# Patient Record
Sex: Female | Born: 1955 | Race: Black or African American | Hispanic: No | Marital: Single | Smoking: Current every day smoker
Health system: Southern US, Community
[De-identification: ages and names within clinical notes are randomized; demographics above are authoritative.]

## PROBLEM LIST (undated history)

## (undated) DIAGNOSIS — K219 Gastro-esophageal reflux disease without esophagitis: Secondary | ICD-10-CM

## (undated) DIAGNOSIS — F32A Depression, unspecified: Secondary | ICD-10-CM

## (undated) DIAGNOSIS — I10 Essential (primary) hypertension: Secondary | ICD-10-CM

## (undated) DIAGNOSIS — E282 Polycystic ovarian syndrome: Secondary | ICD-10-CM

## (undated) DIAGNOSIS — F419 Anxiety disorder, unspecified: Secondary | ICD-10-CM

## (undated) DIAGNOSIS — E559 Vitamin D deficiency, unspecified: Secondary | ICD-10-CM

## (undated) DIAGNOSIS — M1990 Unspecified osteoarthritis, unspecified site: Secondary | ICD-10-CM

## (undated) DIAGNOSIS — F329 Major depressive disorder, single episode, unspecified: Secondary | ICD-10-CM

## (undated) DIAGNOSIS — M549 Dorsalgia, unspecified: Secondary | ICD-10-CM

## (undated) HISTORY — PX: TUBAL LIGATION: SHX77

## (undated) HISTORY — PX: OTHER SURGICAL HISTORY: SHX169

## (undated) HISTORY — PX: ABDOMINAL HYSTERECTOMY: SHX81

## (undated) HISTORY — PX: PARTIAL HYSTERECTOMY: SHX80

## (undated) HISTORY — PX: BACK SURGERY: SHX140

---

## 2000-02-24 ENCOUNTER — Encounter: Payer: Self-pay | Admitting: Neurosurgery

## 2000-02-26 ENCOUNTER — Encounter: Payer: Self-pay | Admitting: Neurosurgery

## 2000-02-26 ENCOUNTER — Ambulatory Visit (HOSPITAL_COMMUNITY): Admission: RE | Admit: 2000-02-26 | Discharge: 2000-02-27 | Payer: Self-pay | Admitting: Neurosurgery

## 2000-03-21 ENCOUNTER — Ambulatory Visit (HOSPITAL_COMMUNITY): Admission: RE | Admit: 2000-03-21 | Discharge: 2000-03-21 | Payer: Self-pay | Admitting: Neurosurgery

## 2000-03-21 ENCOUNTER — Encounter: Payer: Self-pay | Admitting: Neurosurgery

## 2000-04-16 ENCOUNTER — Ambulatory Visit (HOSPITAL_COMMUNITY): Admission: RE | Admit: 2000-04-16 | Discharge: 2000-04-16 | Payer: Self-pay | Admitting: Neurosurgery

## 2000-04-16 ENCOUNTER — Encounter: Payer: Self-pay | Admitting: Neurosurgery

## 2004-05-01 ENCOUNTER — Emergency Department (HOSPITAL_COMMUNITY): Admission: EM | Admit: 2004-05-01 | Discharge: 2004-05-01 | Payer: Self-pay | Admitting: Emergency Medicine

## 2006-05-11 ENCOUNTER — Ambulatory Visit: Payer: Self-pay

## 2007-06-07 ENCOUNTER — Ambulatory Visit: Payer: Self-pay

## 2008-04-01 ENCOUNTER — Ambulatory Visit: Payer: Self-pay | Admitting: Family Medicine

## 2008-06-26 ENCOUNTER — Ambulatory Visit: Payer: Self-pay

## 2008-07-04 ENCOUNTER — Ambulatory Visit: Payer: Self-pay

## 2008-07-23 ENCOUNTER — Ambulatory Visit: Payer: Self-pay

## 2009-01-08 ENCOUNTER — Ambulatory Visit: Payer: Self-pay

## 2009-03-19 ENCOUNTER — Ambulatory Visit: Payer: Self-pay | Admitting: Internal Medicine

## 2009-07-23 ENCOUNTER — Ambulatory Visit: Payer: Self-pay | Admitting: Family Medicine

## 2010-02-12 DIAGNOSIS — G5600 Carpal tunnel syndrome, unspecified upper limb: Secondary | ICD-10-CM | POA: Insufficient documentation

## 2010-02-25 DIAGNOSIS — M2550 Pain in unspecified joint: Secondary | ICD-10-CM | POA: Insufficient documentation

## 2010-03-16 ENCOUNTER — Encounter: Payer: Self-pay | Admitting: Orthopedic Surgery

## 2010-03-24 ENCOUNTER — Ambulatory Visit: Payer: Self-pay | Admitting: Orthopedic Surgery

## 2010-03-27 ENCOUNTER — Encounter: Payer: Self-pay | Admitting: Orthopedic Surgery

## 2010-06-03 ENCOUNTER — Encounter: Payer: Self-pay | Admitting: Orthopedic Surgery

## 2010-06-27 ENCOUNTER — Encounter: Payer: Self-pay | Admitting: Orthopedic Surgery

## 2010-07-06 ENCOUNTER — Encounter: Payer: Self-pay | Admitting: Orthopedic Surgery

## 2010-07-14 ENCOUNTER — Ambulatory Visit: Payer: Self-pay | Admitting: Family Medicine

## 2010-07-28 ENCOUNTER — Encounter: Payer: Self-pay | Admitting: Orthopedic Surgery

## 2010-08-12 ENCOUNTER — Ambulatory Visit: Payer: Self-pay | Admitting: Orthopedic Surgery

## 2010-09-08 ENCOUNTER — Ambulatory Visit: Payer: Self-pay | Admitting: Pain Medicine

## 2010-10-05 ENCOUNTER — Ambulatory Visit: Payer: Self-pay | Admitting: Pain Medicine

## 2010-10-08 ENCOUNTER — Ambulatory Visit: Payer: Self-pay | Admitting: Pain Medicine

## 2010-10-22 ENCOUNTER — Ambulatory Visit: Payer: Self-pay | Admitting: Gastroenterology

## 2010-10-23 LAB — PATHOLOGY REPORT

## 2011-04-23 ENCOUNTER — Emergency Department (HOSPITAL_COMMUNITY)
Admission: EM | Admit: 2011-04-23 | Discharge: 2011-04-23 | Disposition: A | Discharge status: Home / Self Care | Payer: Self-pay | Attending: Emergency Medicine | Admitting: Emergency Medicine

## 2011-04-23 ENCOUNTER — Encounter: Payer: Self-pay | Admitting: *Deleted

## 2011-04-23 DIAGNOSIS — S61419A Laceration without foreign body of unspecified hand, initial encounter: Secondary | ICD-10-CM

## 2011-04-23 DIAGNOSIS — I10 Essential (primary) hypertension: Secondary | ICD-10-CM | POA: Insufficient documentation

## 2011-04-23 DIAGNOSIS — F17200 Nicotine dependence, unspecified, uncomplicated: Secondary | ICD-10-CM | POA: Insufficient documentation

## 2011-04-23 DIAGNOSIS — S61409A Unspecified open wound of unspecified hand, initial encounter: Secondary | ICD-10-CM | POA: Insufficient documentation

## 2011-04-23 DIAGNOSIS — Y92009 Unspecified place in unspecified non-institutional (private) residence as the place of occurrence of the external cause: Secondary | ICD-10-CM | POA: Insufficient documentation

## 2011-04-23 DIAGNOSIS — W268XXA Contact with other sharp object(s), not elsewhere classified, initial encounter: Secondary | ICD-10-CM | POA: Insufficient documentation

## 2011-04-23 HISTORY — DX: Essential (primary) hypertension: I10

## 2011-04-23 MED ORDER — DOUBLE ANTIBIOTIC 500-10000 UNIT/GM EX OINT
TOPICAL_OINTMENT | Freq: Once | CUTANEOUS | Status: AC
  Administered 2011-04-23: 08:00:00 via TOPICAL
  Filled 2011-04-23: qty 1

## 2011-04-23 MED ORDER — CEPHALEXIN 250 MG PO CAPS: 250.0000 mg | ORAL_CAPSULE | Freq: Four times a day (QID) | ORAL | Status: AC

## 2011-04-23 MED ORDER — TETANUS-DIPHTH-ACELL PERTUSSIS 5-2.5-18.5 LF-MCG/0.5 IM SUSP
0.5000 mL | Freq: Once | INTRAMUSCULAR | Status: AC
  Administered 2011-04-23: 0.5 mL via INTRAMUSCULAR
  Filled 2011-04-23: qty 0.5

## 2011-04-23 NOTE — ED Provider Notes (Signed)
History     Chief Complaint  Patient presents with  . Extremity Laceration   Patient is a 55 y.o. female presenting with skin laceration. The history is provided by the patient. No language interpreter was used.  Laceration  Incident onset: 18 hours ago. Pain location: volar aspect of right wrist. Size: .5cm. Injury mechanism: curtain rod. The patient is experiencing no pain. She reports no foreign bodies present. Her tetanus status is unknown.  Patient c/o laceration to anterior aspect of right wrist sustained approximately 18 hours ago while she was putting up a curtain rod and went to catch the rod as it slipped out of her hand. Tetanus status unknown. Patient is right hand dominant. Denies numbness, tingling. No other complaints. Putting up a curtain rod and sustained laceration to right wrist approximately 18 hours ago  Patient seen at 7:39 AM   Past Medical History  Diagnosis Date  . Hypertension     Past Surgical History  Procedure Date  . Carpel tunnel     No family history on file.  History  Substance Use Topics  . Smoking status: Current Everyday Smoker  . Smokeless tobacco: Not on file  . Alcohol Use: No    OB History    Grav Para Term Preterm Abortions TAB SAB Ect Mult Living                  Review of Systems  Skin:       Laceration  Neurological: Negative for numbness.  All other systems reviewed and are negative.  All other systems negative except as noted in HPI.   Physical Exam  BP 156/85  Pulse 72  Temp(Src) 98.8 F (37.1 C) (Oral)  Resp 16  Ht 5\' 6"  (1.676 m)  Wt 150 lb (68.04 kg)  BMI 24.21 kg/m2  SpO2 100%  Physical Exam CONSTITUTIONAL: Well developed/well nourished, hypertensive HEAD AND FACE: Normocephalic/atraumatic EYES: EOMI/PERRL ENMT: Mucous membranes moist NECK: supple, ROM normal CV: regular rate LUNGS: no apparent distress NEURO: Pt is awake/alert, moves all extremitiesx4,  neurovascular intact distal to  injury EXTREMITIES: pulses normal, full ROM of right wrist and fingers SKIN: warm, color normal, .5cm laceration to volar aspect of right wrist with no active bleeding andno bone or tendon exposed with distal cap refill and neurovascular intact PSYCH: no abnormalities of mood noted  ED Course  Wound repair Date/Time: 04/23/2011 8:16 AM Performed by: Joya Gaskins Authorized by: Joya Gaskins Consent: Verbal consent obtained. Risks and benefits: risks, benefits and alternatives were discussed Consent given by: patient Preparation: Patient was prepped and draped in the usual sterile fashion. Local anesthesia used: no Patient sedated: no Patient tolerance: Patient tolerated the procedure well with no immediate complications. Comments: Wound was cleansed with high flow tap water, skin around wound was cleansed with betadine and wound cleansed with safe cleans.  Wound not repaired due to timing of injury.  No bone/tendon/foreign body noted   7:46 AM-Patient advised of reasons to return to ED for current condition MDM Nursing notes reviewed and considered in documentation    Chart written by Clarita Crane acting as scribe for Joya Gaskins, MD  I personally performed the services described in this documentation, which was scribed in my presence. The recorded information has been reviewed and considered. Joya Gaskins, MD      Joya Gaskins, MD 04/23/11 [phone]

## 2011-04-23 NOTE — ED Notes (Signed)
Pt states she cut right wrist on curtain rod yesterday at 1410.

## 2011-07-28 ENCOUNTER — Ambulatory Visit: Payer: Self-pay

## 2012-08-09 ENCOUNTER — Ambulatory Visit: Payer: Self-pay

## 2013-02-07 ENCOUNTER — Encounter: Payer: Self-pay | Admitting: Family Medicine

## 2013-02-25 ENCOUNTER — Encounter: Payer: Self-pay | Admitting: Family Medicine

## 2013-03-27 ENCOUNTER — Encounter: Payer: Self-pay | Admitting: Family Medicine

## 2013-08-10 ENCOUNTER — Ambulatory Visit: Payer: Self-pay | Admitting: Family Medicine

## 2014-09-08 ENCOUNTER — Emergency Department (HOSPITAL_COMMUNITY): Payer: BC Managed Care – PPO

## 2014-09-08 ENCOUNTER — Emergency Department (HOSPITAL_COMMUNITY)
Admission: EM | Admit: 2014-09-08 | Discharge: 2014-09-08 | Disposition: A | Discharge status: Home / Self Care | Payer: BC Managed Care – PPO | Attending: Emergency Medicine | Admitting: Emergency Medicine

## 2014-09-08 ENCOUNTER — Encounter (HOSPITAL_COMMUNITY): Payer: Self-pay | Admitting: Emergency Medicine

## 2014-09-08 DIAGNOSIS — R1013 Epigastric pain: Secondary | ICD-10-CM | POA: Insufficient documentation

## 2014-09-08 DIAGNOSIS — R1011 Right upper quadrant pain: Secondary | ICD-10-CM | POA: Diagnosis not present

## 2014-09-08 DIAGNOSIS — Z720 Tobacco use: Secondary | ICD-10-CM | POA: Diagnosis not present

## 2014-09-08 DIAGNOSIS — G4700 Insomnia, unspecified: Secondary | ICD-10-CM | POA: Insufficient documentation

## 2014-09-08 DIAGNOSIS — R109 Unspecified abdominal pain: Secondary | ICD-10-CM

## 2014-09-08 DIAGNOSIS — D3500 Benign neoplasm of unspecified adrenal gland: Secondary | ICD-10-CM

## 2014-09-08 DIAGNOSIS — K760 Fatty (change of) liver, not elsewhere classified: Secondary | ICD-10-CM

## 2014-09-08 DIAGNOSIS — R1012 Left upper quadrant pain: Secondary | ICD-10-CM | POA: Insufficient documentation

## 2014-09-08 DIAGNOSIS — K429 Umbilical hernia without obstruction or gangrene: Secondary | ICD-10-CM

## 2014-09-08 DIAGNOSIS — Z79899 Other long term (current) drug therapy: Secondary | ICD-10-CM | POA: Insufficient documentation

## 2014-09-08 HISTORY — DX: Polycystic ovarian syndrome: E28.2

## 2014-09-08 LAB — URINALYSIS, ROUTINE W REFLEX MICROSCOPIC
Bilirubin Urine: NEGATIVE
GLUCOSE, UA: NEGATIVE mg/dL
HGB URINE DIPSTICK: NEGATIVE
Ketones, ur: NEGATIVE mg/dL
LEUKOCYTES UA: NEGATIVE
Nitrite: NEGATIVE
PH: 6 (ref 5.0–8.0)
PROTEIN: NEGATIVE mg/dL
Specific Gravity, Urine: 1.015 (ref 1.005–1.030)
Urobilinogen, UA: 0.2 mg/dL (ref 0.0–1.0)

## 2014-09-08 LAB — LIPASE, BLOOD: LIPASE: 19 U/L (ref 11–59)

## 2014-09-08 LAB — CBC WITH DIFFERENTIAL/PLATELET
BASOS ABS: 0 10*3/uL (ref 0.0–0.1)
BASOS PCT: 0 % (ref 0–1)
Eosinophils Absolute: 0.1 10*3/uL (ref 0.0–0.7)
Eosinophils Relative: 2 % (ref 0–5)
HCT: 42 % (ref 36.0–46.0)
Hemoglobin: 14.3 g/dL (ref 12.0–15.0)
LYMPHS PCT: 45 % (ref 12–46)
Lymphs Abs: 2.6 10*3/uL (ref 0.7–4.0)
MCH: 30.6 pg (ref 26.0–34.0)
MCHC: 34 g/dL (ref 30.0–36.0)
MCV: 89.9 fL (ref 78.0–100.0)
Monocytes Absolute: 0.4 10*3/uL (ref 0.1–1.0)
Monocytes Relative: 7 % (ref 3–12)
NEUTROS ABS: 2.7 10*3/uL (ref 1.7–7.7)
Neutrophils Relative %: 46 % (ref 43–77)
PLATELETS: 251 10*3/uL (ref 150–400)
RBC: 4.67 MIL/uL (ref 3.87–5.11)
RDW: 13.7 % (ref 11.5–15.5)
WBC: 5.9 10*3/uL (ref 4.0–10.5)

## 2014-09-08 LAB — COMPREHENSIVE METABOLIC PANEL
ALBUMIN: 3.8 g/dL (ref 3.5–5.2)
ALT: 12 U/L (ref 0–35)
AST: 16 U/L (ref 0–37)
Alkaline Phosphatase: 51 U/L (ref 39–117)
Anion gap: 11 (ref 5–15)
BILIRUBIN TOTAL: 0.3 mg/dL (ref 0.3–1.2)
BUN: 9 mg/dL (ref 6–23)
CALCIUM: 9.2 mg/dL (ref 8.4–10.5)
CHLORIDE: 105 meq/L (ref 96–112)
CO2: 24 meq/L (ref 19–32)
Creatinine, Ser: 1 mg/dL (ref 0.50–1.10)
GFR calc Af Amer: 71 mL/min — ABNORMAL LOW (ref >90)
GFR, EST NON AFRICAN AMERICAN: 61 mL/min — AB (ref >90)
Glucose, Bld: 94 mg/dL (ref 70–99)
Potassium: 4 mEq/L (ref 3.7–5.3)
SODIUM: 140 meq/L (ref 137–147)
Total Protein: 7.1 g/dL (ref 6.0–8.3)

## 2014-09-08 LAB — POC OCCULT BLOOD, ED: Fecal Occult Bld: NEGATIVE

## 2014-09-08 MED ORDER — GI COCKTAIL ~~LOC~~
30.0000 mL | Freq: Once | ORAL | Status: AC
  Administered 2014-09-08: 30 mL via ORAL
  Filled 2014-09-08: qty 30

## 2014-09-08 MED ORDER — RANITIDINE HCL 150 MG PO TABS: 150.0000 mg | ORAL_TABLET | Freq: Two times a day (BID) | ORAL | Status: DC

## 2014-09-08 MED ORDER — IOHEXOL 300 MG/ML  SOLN
100.0000 mL | Freq: Once | INTRAMUSCULAR | Status: AC | PRN
  Administered 2014-09-08: 100 mL via INTRAVENOUS

## 2014-09-08 MED ORDER — IOHEXOL 300 MG/ML  SOLN
50.0000 mL | Freq: Once | INTRAMUSCULAR | Status: AC | PRN
Start: 2014-09-08 — End: 2014-09-08
  Administered 2014-09-08: 50 mL via ORAL

## 2014-09-08 NOTE — ED Notes (Signed)
MD at bedside. 

## 2014-09-08 NOTE — Discharge Instructions (Signed)
Please call your doctor for a followup appointment within 24-48 hours. When you talk to your doctor please let them know that you were seen in the emergency department and have them acquire all of your records so that they can discuss the findings with you and formulate a treatment plan to fully care for your new and ongoing problems. ° °Abdominal Pain °Many things can cause abdominal pain. Usually, abdominal pain is not caused by a disease and will improve without treatment. It can often be observed and treated at home. Your health care provider will do a physical exam and possibly order blood tests and X-rays to help determine the seriousness of your pain. However, in many cases, more time must pass before a clear cause of the pain can be found. Before that point, your health care provider may not know if you need more testing or further treatment. °HOME CARE INSTRUCTIONS  °Monitor your abdominal pain for any changes. The following actions may help to alleviate any discomfort you are experiencing: °· Only take over-the-counter or prescription medicines as directed by your health care provider. °· Do not take laxatives unless directed to do so by your health care provider. °· Try a clear liquid diet (broth, tea, or water) as directed by your health care provider. Slowly move to a bland diet as tolerated. °SEEK MEDICAL CARE IF: °· You have unexplained abdominal pain. °· You have abdominal pain associated with nausea or diarrhea. °· You have pain when you urinate or have a bowel movement. °· You experience abdominal pain that wakes you in the night. °· You have abdominal pain that is worsened or improved by eating food. °· You have abdominal pain that is worsened with eating fatty foods. °· You have a fever. °SEEK IMMEDIATE MEDICAL CARE IF:  °· Your pain does not go away within 2 hours. °· You keep throwing up (vomiting). °· Your pain is felt only in portions of the abdomen, such as the right side or the left lower  portion of the abdomen. °· You pass bloody or black tarry stools. °MAKE SURE YOU: °· Understand these instructions.   °· Will watch your condition.   °· Will get help right away if you are not doing well or get worse.   °Document Released: 06/23/2005 Document Revised: 09/18/2013 Document Reviewed: 05/23/2013 °[employer]® Patient Information ©2015 [employer]. This information is not intended to replace advice given to you by your health care provider. Make sure you discuss any questions you have with your health care provider. ° °

## 2014-09-08 NOTE — ED Provider Notes (Signed)
CSN: [id number]     Arrival date & time 09/08/14  0747 History   First MD Initiated Contact with Patient 09/08/14 [phone]     Chief Complaint  Patient presents with  . Abdominal Pain     (Consider location/radiation/quality/duration/timing/severity/associated sxs/prior Treatment) Patient is a 58 y.o. female presenting with abdominal pain. The history is provided by the patient.  Abdominal Pain Pain location:  Epigastric Pain quality: aching, bloating and heavy   Pain radiates to:  Suprapubic region Pain severity:  Moderate Onset quality:  Gradual Duration:  4 weeks Timing:  Constant Progression:  Worsening Chronicity:  New Relieved by:  Nothing Worsened by:  Eating Ineffective treatments:  OTC medications Associated symptoms: belching and dysuria   Associated symptoms: no chest pain, no chills, no constipation, no cough, no diarrhea, no fever, no nausea, no shortness of breath and no vomiting    RHEANNA Spencer is a 58 y.o. female who presents to the ED with upper abdominal pain that started a month ago. Hx of HTN and PCOS. She is not sexually active and has had a hysterectomy. She still has her ovaries. She does smoke 1/2 ppd.   Past Medical History  Diagnosis Date  . Hypertension   . Polycystic disease, ovaries    Past Surgical History  Procedure Laterality Date  . Carpel tunnel    . Partial hysterectomy     Family History  Problem Relation Age of Onset  . Stroke Mother   . Cancer Father   . Kidney disease Mother    History  Substance Use Topics  . Smoking status: Current Every Day Smoker -- 0.50 packs/day for 30 years    Types: Cigarettes  . Smokeless tobacco: Never Used  . Alcohol Use: No   OB History    Gravida Para Term Preterm AB TAB SAB Ectopic Multiple Living            0     Review of Systems  Constitutional: Negative for fever and chills.  HENT: Negative.   Eyes: Positive for visual disturbance. Negative for pain, redness and itching.   Respiratory: Negative for cough, shortness of breath and wheezing.   Cardiovascular: Negative for chest pain and palpitations.  Gastrointestinal: Positive for abdominal pain. Negative for nausea, vomiting, diarrhea and constipation.  Genitourinary: Positive for dysuria and urgency. Negative for frequency.  Musculoskeletal: Negative for neck pain and neck stiffness. Back pain: chronic.  Skin: Negative for rash.  Neurological: Negative for dizziness, syncope and headaches.  Psychiatric/Behavioral: Negative for confusion. The patient is not nervous/anxious.       Allergies  Review of patient's allergies indicates no known allergies.  Home Medications   Prior to Admission medications   Medication Sig Start Date End Date Taking? Authorizing Provider  acetaminophen (TYLENOL) 500 MG tablet Take 1,000 mg by mouth every 6 (six) hours as needed. For pain    Yes Historical Provider, MD  amLODipine (NORVASC) 10 MG tablet Take 1 tablet by mouth daily. 08/28/14  Yes Historical Provider, MD  BLACK COHOSH PO Take 1 tablet by mouth daily as needed (menopause).   Yes Historical Provider, MD  Cholecalciferol (VITAMIN D PO) Take 1 tablet by mouth daily.   Yes Historical Provider, MD  Cyanocobalamin (VITAMIN B-12 PO) Take 1 tablet by mouth every other day.   Yes Historical Provider, MD  GARLIC PO Take 1 tablet by mouth daily.   Yes Historical Provider, MD  LORazepam (ATIVAN) 1 MG tablet Take 0.5 tablets by mouth 3 (  three) times daily as needed for anxiety.  08/28/14  Yes Historical Provider, MD  ranitidine (ZANTAC) 150 MG tablet Take 1 tablet (150 mg total) by mouth 2 (two) times daily. 09/08/14   [name], MD   BP 133/95 mmHg  Pulse 64  Temp(Src) 98.2 F (36.8 C) (Oral)  Resp 20  Ht 5' 6.5" (1.689 m)  Wt 162 lb (73.483 kg)  BMI 25.76 kg/m2  SpO2 100% Physical Exam  Constitutional: She is oriented to person, place, and time. She appears well-developed and well-nourished.  HENT:  Head:  Normocephalic and atraumatic.  Right Ear: Tympanic membrane normal.  Left Ear: Tympanic membrane normal.  Nose: Nose normal.  Mouth/Throat: Uvula is midline, oropharynx is clear and moist and mucous membranes are normal.  Eyes: EOM are normal.  Neck: Normal range of motion. Neck supple. No JVD present.  Cardiovascular: Normal rate and regular rhythm.   Pulmonary/Chest: Effort normal and breath sounds normal.  Abdominal: Soft. Bowel sounds are normal. There is tenderness in the right upper quadrant, epigastric area, periumbilical area and left upper quadrant.  Musculoskeletal: Normal range of motion. She exhibits no edema.  Neurological: She is alert and oriented to person, place, and time. No cranial nerve deficit.  Skin: Skin is warm and dry.  Psychiatric: She has a normal mood and affect. Her behavior is normal.  Nursing note and vitals reviewed.   ED Course  Procedures (including critical care time) Labs Review Results for orders placed or performed during the hospital encounter of 09/08/14 (from the past 24 hour(s))  CBC with Differential     Status: None   Collection Time: 09/08/14  8:14 AM  Result Value Ref Range   WBC 5.9 4.0 - 10.5 K/uL   RBC 4.67 3.87 - 5.11 MIL/uL   Hemoglobin 14.3 12.0 - 15.0 g/dL   HCT 42.0 36.0 - 46.0 %   MCV 89.9 78.0 - 100.0 fL   MCH 30.6 26.0 - 34.0 pg   MCHC 34.0 30.0 - 36.0 g/dL   RDW 13.7 11.5 - 15.5 %   Platelets 251 150 - 400 K/uL   Neutrophils Relative % 46 43 - 77 %   Neutro Abs 2.7 1.7 - 7.7 K/uL   Lymphocytes Relative 45 12 - 46 %   Lymphs Abs 2.6 0.7 - 4.0 K/uL   Monocytes Relative 7 3 - 12 %   Monocytes Absolute 0.4 0.1 - 1.0 K/uL   Eosinophils Relative 2 0 - 5 %   Eosinophils Absolute 0.1 0.0 - 0.7 K/uL   Basophils Relative 0 0 - 1 %   Basophils Absolute 0.0 0.0 - 0.1 K/uL  Comprehensive metabolic panel     Status: Abnormal   Collection Time: 09/08/14  8:14 AM  Result Value Ref Range   Sodium 140 137 - 147 mEq/L   Potassium  4.0 3.7 - 5.3 mEq/L   Chloride 105 96 - 112 mEq/L   CO2 24 19 - 32 mEq/L   Glucose, Bld 94 70 - 99 mg/dL   BUN 9 6 - 23 mg/dL   Creatinine, Ser 1.00 0.50 - 1.10 mg/dL   Calcium 9.2 8.4 - 10.5 mg/dL   Total Protein 7.1 6.0 - 8.3 g/dL   Albumin 3.8 3.5 - 5.2 g/dL   AST 16 0 - 37 U/L   ALT 12 0 - 35 U/L   Alkaline Phosphatase 51 39 - 117 U/L   Total Bilirubin 0.3 0.3 - 1.2 mg/dL   GFR calc non  Af Amer 61 (L) >90 mL/min   GFR calc Af Amer 71 (L) >90 mL/min   Anion gap 11 5 - 15  Lipase, blood     Status: None   Collection Time: 09/08/14  8:14 AM  Result Value Ref Range   Lipase 19 11 - 59 U/L  Urinalysis, Routine w reflex microscopic     Status: None   Collection Time: 09/08/14  8:21 AM  Result Value Ref Range   Color, Urine YELLOW YELLOW   APPearance CLEAR CLEAR   Specific Gravity, Urine 1.015 1.005 - 1.030   pH 6.0 5.0 - 8.0   Glucose, UA NEGATIVE NEGATIVE mg/dL   Hgb urine dipstick NEGATIVE NEGATIVE   Bilirubin Urine NEGATIVE NEGATIVE   Ketones, ur NEGATIVE NEGATIVE mg/dL   Protein, ur NEGATIVE NEGATIVE mg/dL   Urobilinogen, UA 0.2 0.0 - 1.0 mg/dL   Nitrite NEGATIVE NEGATIVE   Leukocytes, UA NEGATIVE NEGATIVE  POC occult blood, ED Provider will collect     Status: None   Collection Time: 09/08/14  8:50 AM  Result Value Ref Range   Fecal Occult Bld NEGATIVE NEGATIVE    Imaging Review Ct Abdomen Pelvis W Contrast  09/08/2014   CLINICAL DATA:  Epigastric pain for more than a month. Pain increases with stress or when hungry. Burning with urination. Previous partial hysterectomy.  EXAM: CT ABDOMEN AND PELVIS WITH CONTRAST  TECHNIQUE: Multidetector CT imaging of the abdomen and pelvis was performed using the standard protocol following bolus administration of intravenous contrast.  CONTRAST:  20mL OMNIPAQUE IOHEXOL 300 MG/ML SOLN, 173mL OMNIPAQUE IOHEXOL 300 MG/ML SOLN  COMPARISON:  None.  FINDINGS: Lower chest: Minimal dependent changes in the lung bases. Emphysematous changes  are noted.  Upper abdomen: There is focal fatty infiltration adjacent to the falciform ligament. Within the lower portion of the right hepatic lobe there is a 5 mm low-attenuation lesion. Cyst favored to represent a cyst. An 8 mm lesion is identified within the anterior segment of the right hepatic lobe, not further characterized. No focal abnormality identified within the spleen, pancreas, a for left adrenal gland. There is a benign adenoma involving the right adrenal gland, measuring 1.3 cm. No focal abnormality identified within the kidneys. There is bilateral symmetric renal enhancement and excretion. The gallbladder is present.  Gastrointestinal tract: The stomach and small bowel loops are normal in appearance. The appendix is well seen and has a normal appearance. Colonic loops have a normal appearance.  Pelvis: The uterus is absent. No adnexal mass. No free pelvic fluid. Normal appearance of the urinary bladder.  Retroperitoneum: There is mild atherosclerotic calcification of the abdominal aorta. No aneurysm. No retroperitoneal or mesenteric adenopathy.  Abdominal wall: Small fat containing paraumbilical hernia.  Osseous structures: There are degenerative changes in both hips. No suspicious lytic or blastic lesions are identified.  IMPRESSION: 1. Small liver lesions, statistically likely benign. 2. Fatty infiltration of the liver. 3. Benign right adrenal adenoma 1.3 cm. 4. Normal appearing stomach. 5. Normal appendix. 6. Status post hysterectomy. 7. Small fat containing paraumbilical hernia. 8. Degenerative changes in both hips.   Electronically Signed   By: Shon Hale M.D.   On: 09/08/2014 10:34   EKG Interpretation  MDM  58 y.o. female with abdominal pain for over one month. Dr. Sabra Heck in to discuss CT and lab results with the patient and plan of care. She voices understanding and agrees with plan.  D/c done by Dr. Sabra Heck.   Final diagnoses:  Abdominal pain  Adrenal adenoma, unspecified laterality   Fatty liver  Umbilical hernia without obstruction and without gangrene        Ashley Murrain, NP 09/08/14 1102  [name], MD 09/08/14 1556

## 2014-09-08 NOTE — ED Notes (Signed)
PT states epigastric pain for over a month and increases with stress or when hungry. PT also c/o burring with urination.

## 2014-09-08 NOTE — ED Notes (Signed)
NP at bedside.

## 2014-09-08 NOTE — ED Provider Notes (Signed)
58 year old female, no prior abdominal surgical history who presents with epigastric discomfort. This has been ongoing for approximately a month, on exam she has reproducible tenderness in the left upper, right upper and mid epigastrium, there is a fullness but no guarding or peritoneal signs, no pain in the lower abdomen, normal cardiac and pulmonary exams, no peripheral edema. Labs and CT scan will be ordered to further rule out other sources such as pancreatitis, peptic ulcer disease, cancer or other sources of an increased fullness and pain. The patient is in agreement with the plan.   EKG Interpretation  Date/Time:  Sunday September 08 2014 08:11:02 EST Ventricular Rate:  79 PR Interval:  156 QRS Duration: 76 QT Interval:  361 QTC Calculation: 414 R Axis:   30 Text Interpretation:  Normal sinus rhythm Normal ECG since last tracing no significant change Confirmed by Cesario Weidinger  MD, Rider Ermis ([id number]) on 09/08/2014 3:56:34 PM        Medical screening examination/treatment/procedure(s) were conducted as a shared visit with non-physician practitioner(s) and myself.  I personally evaluated the patient during the encounter.  Clinical Impression:   Final diagnoses:  Abdominal pain  Adrenal adenoma, unspecified laterality  Fatty liver  Umbilical hernia without obstruction and without gangrene         [name], MD 09/08/14 1556

## 2014-09-08 NOTE — ED Notes (Signed)
Patient c/o upper abd pain. Per patient started a month ago. Per patient started after getting stressed from "overseer" a month ago and pain has been getting progressively worse since. Denies any nausea, vomiting, diarrhea, fevers, or urinary symptoms.

## 2014-10-10 ENCOUNTER — Ambulatory Visit: Payer: Self-pay | Admitting: Family Medicine

## 2014-11-12 DIAGNOSIS — R1013 Epigastric pain: Secondary | ICD-10-CM | POA: Insufficient documentation

## 2015-01-24 ENCOUNTER — Other Ambulatory Visit: Payer: Self-pay

## 2015-01-24 ENCOUNTER — Ambulatory Visit
Admit: 2015-01-24 | Disposition: A | Discharge status: Home / Self Care | Payer: Self-pay | Attending: Gastroenterology | Admitting: Gastroenterology

## 2015-02-17 LAB — SURGICAL PATHOLOGY

## 2016-04-01 ENCOUNTER — Encounter: Payer: Self-pay | Admitting: *Deleted

## 2016-04-02 ENCOUNTER — Ambulatory Visit: Payer: BLUE CROSS/BLUE SHIELD | Admitting: Anesthesiology

## 2016-04-02 ENCOUNTER — Ambulatory Visit
Admission: RE | Admit: 2016-04-02 | Discharge: 2016-04-02 | Disposition: A | Discharge status: Home / Self Care | Payer: BLUE CROSS/BLUE SHIELD | Source: Ambulatory Visit | Attending: Gastroenterology | Admitting: Gastroenterology

## 2016-04-02 ENCOUNTER — Encounter: Payer: Self-pay | Admitting: *Deleted

## 2016-04-02 ENCOUNTER — Encounter
Admission: RE | Disposition: A | Discharge status: Home / Self Care | Payer: Self-pay | Source: Ambulatory Visit | Attending: Gastroenterology

## 2016-04-02 DIAGNOSIS — K219 Gastro-esophageal reflux disease without esophagitis: Secondary | ICD-10-CM | POA: Insufficient documentation

## 2016-04-02 DIAGNOSIS — D120 Benign neoplasm of cecum: Secondary | ICD-10-CM | POA: Diagnosis not present

## 2016-04-02 DIAGNOSIS — F17200 Nicotine dependence, unspecified, uncomplicated: Secondary | ICD-10-CM | POA: Insufficient documentation

## 2016-04-02 DIAGNOSIS — I10 Essential (primary) hypertension: Secondary | ICD-10-CM | POA: Diagnosis not present

## 2016-04-02 DIAGNOSIS — D123 Benign neoplasm of transverse colon: Secondary | ICD-10-CM | POA: Diagnosis not present

## 2016-04-02 DIAGNOSIS — K5730 Diverticulosis of large intestine without perforation or abscess without bleeding: Secondary | ICD-10-CM | POA: Insufficient documentation

## 2016-04-02 DIAGNOSIS — Z1211 Encounter for screening for malignant neoplasm of colon: Secondary | ICD-10-CM | POA: Insufficient documentation

## 2016-04-02 DIAGNOSIS — Z886 Allergy status to analgesic agent status: Secondary | ICD-10-CM | POA: Diagnosis not present

## 2016-04-02 DIAGNOSIS — E282 Polycystic ovarian syndrome: Secondary | ICD-10-CM | POA: Diagnosis not present

## 2016-04-02 DIAGNOSIS — Z86010 Personal history of colonic polyps: Secondary | ICD-10-CM | POA: Diagnosis present

## 2016-04-02 DIAGNOSIS — F329 Major depressive disorder, single episode, unspecified: Secondary | ICD-10-CM | POA: Diagnosis not present

## 2016-04-02 DIAGNOSIS — Z79899 Other long term (current) drug therapy: Secondary | ICD-10-CM | POA: Diagnosis not present

## 2016-04-02 DIAGNOSIS — Z888 Allergy status to other drugs, medicaments and biological substances status: Secondary | ICD-10-CM | POA: Insufficient documentation

## 2016-04-02 DIAGNOSIS — E559 Vitamin D deficiency, unspecified: Secondary | ICD-10-CM | POA: Diagnosis not present

## 2016-04-02 DIAGNOSIS — M1990 Unspecified osteoarthritis, unspecified site: Secondary | ICD-10-CM | POA: Diagnosis not present

## 2016-04-02 DIAGNOSIS — F419 Anxiety disorder, unspecified: Secondary | ICD-10-CM | POA: Insufficient documentation

## 2016-04-02 HISTORY — DX: Dorsalgia, unspecified: M54.9

## 2016-04-02 HISTORY — DX: Gastro-esophageal reflux disease without esophagitis: K21.9

## 2016-04-02 HISTORY — PX: COLONOSCOPY WITH PROPOFOL: SHX5780

## 2016-04-02 HISTORY — DX: Unspecified osteoarthritis, unspecified site: M19.90

## 2016-04-02 HISTORY — DX: Vitamin D deficiency, unspecified: E55.9

## 2016-04-02 HISTORY — DX: Anxiety disorder, unspecified: F41.9

## 2016-04-02 HISTORY — DX: Depression, unspecified: F32.A

## 2016-04-02 HISTORY — DX: Major depressive disorder, single episode, unspecified: F32.9

## 2016-04-02 SURGERY — COLONOSCOPY WITH PROPOFOL
Anesthesia: General

## 2016-04-02 MED ORDER — SODIUM CHLORIDE 0.9 % IV SOLN: INTRAVENOUS | Status: DC

## 2016-04-02 MED ORDER — MIDAZOLAM HCL 2 MG/2ML IJ SOLN
INTRAMUSCULAR | Status: DC | PRN
Start: 2016-04-02 — End: 2016-04-02
  Administered 2016-04-02: 1 mg via INTRAVENOUS

## 2016-04-02 MED ORDER — EPHEDRINE SULFATE 50 MG/ML IJ SOLN
INTRAMUSCULAR | Status: DC | PRN
  Administered 2016-04-02 (×2): 5 mg via INTRAVENOUS

## 2016-04-02 MED ORDER — FENTANYL CITRATE (PF) 100 MCG/2ML IJ SOLN
INTRAMUSCULAR | Status: DC | PRN
  Administered 2016-04-02: 50 ug via INTRAVENOUS

## 2016-04-02 MED ORDER — SODIUM CHLORIDE 0.9 % IV SOLN
INTRAVENOUS | Status: DC
  Administered 2016-04-02: 13:00:00 via INTRAVENOUS

## 2016-04-02 MED ORDER — PROPOFOL 500 MG/50ML IV EMUL
INTRAVENOUS | Status: DC | PRN
  Administered 2016-04-02: 120 ug/kg/min via INTRAVENOUS

## 2016-04-02 NOTE — H&P (Signed)
Outpatient short stay form Pre-procedure 04/02/2016 12:59 PM Lollie Sails MD  Primary Physician: Dr. Delight Stare  Reason for visit:  Colonoscopy  History of present illness:  Patient is a 60 year old female presenting today for colonoscopy. There is a personal history of adenomatous colon polyps with her last colonoscopy being done 10/22/2010. She tolerated her prep well. She takes no aspirin or blood thinning products. She will take intermittent 81 mg aspirin.    Current facility-administered medications:  .  0.9 %  sodium chloride infusion, , Intravenous, Continuous, Lollie Sails, MD, Last Rate: 20 mL/hr at 04/02/16 1253 .  0.9 %  sodium chloride infusion, , Intravenous, Continuous, Lollie Sails, MD  Prescriptions prior to admission  Medication Sig Dispense Refill Last Dose  . acetaminophen (TYLENOL) 500 MG tablet Take 1,000 mg by mouth every 6 (six) hours as needed. For pain    Past Week at Unknown time  . amLODipine (NORVASC) 10 MG tablet Take 1 tablet by mouth daily.  4 04/02/2016 at Unknown time  . Cholecalciferol (VITAMIN D PO) Take 1 tablet by mouth daily.   Past Week at Unknown time  . Cyanocobalamin (VITAMIN B-12 PO) Take 1 tablet by mouth every other day.   Past Week at Unknown time  . Flaxseed Oil OIL by Does not apply route.   Past Week at Unknown time  . GARLIC PO Take 1 tablet by mouth daily.   Past Month at Unknown time  . LORazepam (ATIVAN) 1 MG tablet Take 0.5 tablets by mouth 3 (three) times daily as needed for anxiety.   0 Past Month at Unknown time  . pantoprazole (PROTONIX) 40 MG tablet Take 40 mg by mouth daily.   Past Month at Unknown time  . BLACK COHOSH PO Take 1 tablet by mouth daily as needed (menopause). Reported on 04/02/2016   Not Taking at Unknown time  . ranitidine (ZANTAC) 150 MG tablet Take 1 tablet (150 mg total) by mouth 2 (two) times daily. (Patient not taking: Reported on 04/02/2016) 60 tablet 0 Not Taking at Unknown time     Allergies   Allergen Reactions  . Nsaids Other (See Comments)  . Skelaxin [Metaxalone] Other (See Comments)     Past Medical History  Diagnosis Date  . Hypertension   . Polycystic disease, ovaries   . Anxiety   . Depression   . Arthritis   . Back pain   . Vitamin D deficiency   . GERD (gastroesophageal reflux disease)     Review of systems:      Physical Exam    Heart and lungs: Regular rate and rhythm without rub.    HEENT: Normocephalic atraumatic eyes are anicteric    Other:     Pertinant exam for procedure: Soft nontender nondistended bowel sounds positive normoactive.    Planned proceedures: Colonoscopy and indicated procedures I have discussed the risks benefits and complications of procedures to include not limited to bleeding, infection, perforation and the risk of sedation and the patient wishes to proceed.    Lollie Sails, MD Gastroenterology 04/02/2016  12:59 PM

## 2016-04-02 NOTE — Anesthesia Procedure Notes (Signed)
Performed by: COOK-MARTIN, Laurier Jasperson Pre-anesthesia Checklist: Patient identified, Emergency Drugs available, Suction available, Patient being monitored and Timeout performed Patient Re-evaluated:Patient Re-evaluated prior to inductionOxygen Delivery Method: Nasal cannula Preoxygenation: Pre-oxygenation with 100% oxygen Intubation Type: IV induction Placement Confirmation: positive ETCO2 and CO2 detector       

## 2016-04-02 NOTE — Anesthesia Preprocedure Evaluation (Addendum)
Anesthesia Evaluation  Patient identified by MRN, date of birth, ID band Patient awake    Reviewed: Allergy & Precautions, NPO status , Patient's Chart, lab work & pertinent test results, reviewed documented beta blocker date and time   Airway Mallampati: II  TM Distance: >3 FB     Dental  (+) Chipped, Upper Dentures   Pulmonary Current Smoker,           Cardiovascular hypertension, Pt. on medications      Neuro/Psych PSYCHIATRIC DISORDERS Anxiety Depression    GI/Hepatic   Endo/Other    Renal/GU      Musculoskeletal  (+) Arthritis ,   Abdominal   Peds  Hematology   Anesthesia Other Findings   Reproductive/Obstetrics                            Anesthesia Physical Anesthesia Plan  ASA: II  Anesthesia Plan: General   Post-op Pain Management:    Induction: Intravenous  Airway Management Planned: Oral ETT  Additional Equipment:   Intra-op Plan:   Post-operative Plan:   Informed Consent: I have reviewed the patients History and Physical, chart, labs and discussed the procedure including the risks, benefits and alternatives for the proposed anesthesia with the patient or authorized representative who has indicated his/her understanding and acceptance.     Plan Discussed with: CRNA  Anesthesia Plan Comments:         Anesthesia Quick Evaluation

## 2016-04-02 NOTE — OR Nursing (Signed)
Patient does not want drivers to hear results of procedure or hear discharge instructions.  Discussed with patient and written instructions given to her.  Norlene Campbell, RN

## 2016-04-02 NOTE — Anesthesia Postprocedure Evaluation (Signed)
Anesthesia Post Note  Patient: Amy Garrison  Procedure(s) Performed: Procedure(s) (LRB): COLONOSCOPY WITH PROPOFOL (N/A)  Patient location during evaluation: Endoscopy Anesthesia Type: General Level of consciousness: awake and alert Pain management: pain level controlled Vital Signs Assessment: post-procedure vital signs reviewed and stable Respiratory status: spontaneous breathing, nonlabored ventilation, respiratory function stable and patient connected to nasal cannula oxygen Cardiovascular status: blood pressure returned to baseline and stable Postop Assessment: no signs of nausea or vomiting Anesthetic complications: no    Last Vitals:  Filed Vitals:   04/02/16 1445 04/02/16 1455  BP: 130/85 123/80  Pulse: 74 72  Temp:    Resp: 16 20    Last Pain: There were no vitals filed for this visit.               Malina Geers S

## 2016-04-02 NOTE — Transfer of Care (Signed)
Immediate Anesthesia Transfer of Care Note  Patient: Paula Spencer Procedure(s) Performed: Procedure(s): COLONOSCOPY WITH PROPOFOL (N/A)  Patient Location: PACU  Anesthesia Type:General  Level of Consciousness: awake and sedated  Airway & Oxygen Therapy: Patient Spontanous Breathing and Patient connected to nasal cannula oxygen  Post-op Assessment: Report given to RN and Post -op Vital signs reviewed and stable  Post vital signs: Reviewed and stable  Last Vitals:  Filed Vitals:   04/02/16 1424 04/02/16 1425  BP:  117/88  Pulse:  83  Temp: 36.1 C   Resp:  14    Last Pain: There were no vitals filed for this visit.       Complications: No apparent anesthesia complications

## 2016-04-02 NOTE — Op Note (Signed)
[facility] Patient Name: Paula Spencer Procedure Date: 04/02/2016 1:05 PM MRN: [id number] Account #: [id number] Date of Birth: Feb 10, 1956 Admit Type: Outpatient Age: 60 Room: [facility] ENDO ROOM 1 Gender: Female Note Status: Finalized Procedure:            Colonoscopy Indications:          Personal history of colonic polyps Providers:            Lollie Sails, MD Referring MD:         Marguerita Merles, MD (Referring MD) Medicines:            Monitored Anesthesia Care Complications:        Minor hemorrhage - stopped spontaneously Procedure:            Pre-Anesthesia Assessment:                       - ASA Grade Assessment: II - A patient with mild                        systemic disease.                       After obtaining informed consent, the colonoscope was                        passed under direct vision. Throughout the procedure,                        the patient's blood pressure, pulse, and oxygen                        saturations were monitored continuously. The                        Colonoscope was introduced through the anus and                        advanced to the the cecum, identified by appendiceal                        orifice and ileocecal valve. The colonoscopy was                        unusually difficult. The patient tolerated the                        procedure well. The quality of the bowel preparation                        was good. Findings:      A 40 mm polyp was found in the cecum. The polyp was pedunculated with a       broad base. Biopsies were taken with a cold forceps for histology.      Three pedunculated and sessile polyps were found in the proximal       transverse colon. The polyps were 4 to 10 mm in size. These polyps were       removed with a cold snare. Resection and retrieval were complete. To       prevent bleeding after the polypectomy, four hemostatic clips were  successfully placed. There  was no bleeding at the end of the maneuver. 1       clip was placed on one lesion site, 3 were placed on the second site.      A few small-mouthed diverticula were found in the sigmoid colon and       distal sigmoid colon.      The digital rectal exam was normal. Impression:           - One 40 mm polyp in the cecum. Biopsied.                       - Three 4 to 10 mm polyps in the proximal transverse                        colon, removed with a cold snare. Resected and                        retrieved. Clips were placed.                       - Diverticulosis in the sigmoid colon and in the distal                        sigmoid colon. Recommendation:       - Discharge patient to home.                       - Await pathology results. Procedure Code(s):    --- Professional ---                       [phone], Colonoscopy, flexible; with removal of tumor(s),                        polyp(s), or other lesion(s) by snare technique                       45380, 59, Colonoscopy, flexible; with biopsy, single                        or multiple Diagnosis Code(s):    --- Professional ---                       D12.0, Benign neoplasm of cecum                       D12.3, Benign neoplasm of transverse colon (hepatic                        flexure or splenic flexure)                       Z86.010, Personal history of colonic polyps                       K57.30, Diverticulosis of large intestine without                        perforation or abscess without bleeding CPT copyright 2016 American Medical Association. All rights reserved. The codes documented in this report are preliminary and upon coder review may  be revised to meet current  compliance requirements. Lollie Sails, MD 04/02/2016 2:38:38 PM This report has been signed electronically. Number of Addenda: 0 Note Initiated On: 04/02/2016 1:05 PM Scope Withdrawal Time: 0 hours 43 minutes 10 seconds  Total Procedure Duration: 0 hours 59 minutes 46  seconds       [facility]

## 2016-04-04 ENCOUNTER — Encounter: Payer: Self-pay | Admitting: Gastroenterology

## 2016-04-06 LAB — SURGICAL PATHOLOGY

## 2016-04-20 ENCOUNTER — Ambulatory Visit (INDEPENDENT_AMBULATORY_CARE_PROVIDER_SITE_OTHER): Payer: BLUE CROSS/BLUE SHIELD | Admitting: Surgery

## 2016-04-20 ENCOUNTER — Encounter: Payer: Self-pay | Admitting: Surgery

## 2016-04-20 VITALS — BP 144/84 | HR 86 | Temp 99.6°F | Ht 66.0 in | Wt 153.0 lb

## 2016-04-20 DIAGNOSIS — D374 Neoplasm of uncertain behavior of colon: Secondary | ICD-10-CM

## 2016-04-20 MED ORDER — BISACODYL 5 MG PO TBEC: 5.0000 mg | DELAYED_RELEASE_TABLET | Freq: Once | ORAL | 0 refills | Status: AC

## 2016-04-20 MED ORDER — POLYETHYLENE GLYCOL 3350 17 GM/SCOOP PO POWD: 1.0000 | Freq: Once | ORAL | 0 refills | Status: AC

## 2016-04-20 MED ORDER — POLYETHYLENE GLYCOL 3350 17 GM/SCOOP PO POWD: 1.0000 | Freq: Once | ORAL | Status: DC

## 2016-04-20 NOTE — Progress Notes (Signed)
Surgical Consultation  04/20/2016  Paula Spencer is an 61 y.o. female.   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 Anastasio Champion. She had a 40 mm TVA with high-grade dysplasia identified in the cecum. She had smaller polyps in the transverse colon which were removed and benign. She had her first colonoscopy were polyps were identified and 2010 so in 7 years she has developed quite a large TVA. She's had no bleeding no melena no hematochezia and no weight loss.  She has family history of cancer but she's not sure what type and was not sure if it was even in the abdomen.  She works as a Museum/gallery curator and smokes 5 cigarettes per day does not drink alcohol  Past Medical History:  Diagnosis Date  . Anxiety   . Arthritis   . Back pain   . Depression   . GERD (gastroesophageal reflux disease)   . Hypertension   . Polycystic disease, ovaries   . Vitamin D deficiency     Past Surgical History:  Procedure Laterality Date  . ABDOMINAL HYSTERECTOMY    . carpel tunnel    . COLONOSCOPY WITH PROPOFOL N/A 04/02/2016   Procedure: COLONOSCOPY WITH PROPOFOL;  Surgeon: Lollie Sails, MD;  Location: [facility] ENDOSCOPY;  Service: Endoscopy;  Laterality: N/A;  . PARTIAL HYSTERECTOMY      Family History  Problem Relation Age of Onset  . Stroke Mother   . Kidney disease Mother   . Cancer Father   . Aneurysm Brother     Social History:  reports that she has been smoking Cigarettes.  She has a 15.00 pack-year smoking history. She has never used smokeless tobacco. She reports that she does not drink alcohol or use drugs.  Allergies:  Allergies  Allergen Reactions  . Nsaids Other (See Comments)  . Skelaxin [Metaxalone] Other (See Comments)    Medications reviewed.   Review of Systems:   Review of Systems  Constitutional: Negative.   HENT: Negative.   Eyes: Negative.   Respiratory: Negative.    Cardiovascular: Negative.   Gastrointestinal: Negative for abdominal pain, blood in stool, constipation, diarrhea, heartburn, melena, nausea and vomiting.  Genitourinary: Negative.   Musculoskeletal: Negative.   Skin: Negative.   Neurological: Negative.   Endo/Heme/Allergies: Negative.   Psychiatric/Behavioral: Negative.      Physical Exam:  BP (!) 144/84 (BP Location: Right Arm, Patient Position: Sitting, Cuff Size: Small)   Pulse 86   Temp 99.6 F (37.6 C) (Oral)   Ht 5\' 6"  (1.676 m)   Wt 153 lb (69.4 kg)   BMI 24.69 kg/m   Physical Exam  Constitutional: She is oriented to person, place, and time and well-developed, well-nourished, and in no distress. No distress.  HENT:  Head: Normocephalic and atraumatic.  Eyes: Right eye exhibits no discharge. Left eye exhibits no discharge. No scleral icterus.  Neck: Normal range of motion.  Cardiovascular: Normal rate, regular rhythm and normal heart sounds.   Pulmonary/Chest: Effort normal and breath sounds normal. No respiratory distress. She has no wheezes. She has no rales.  Abdominal: Soft. She exhibits no distension. There is no tenderness. There is no rebound.  Periumbilical laparoscopy scar from tubal ligation and Pfannenstiel scar  Musculoskeletal: Normal range of motion. She exhibits no edema or tenderness.  Lymphadenopathy:    She has no cervical adenopathy.  Neurological: She is alert and oriented to person, place,  and time.  Skin: Skin is warm and dry. She is not diaphoretic. No erythema.  Psychiatric: Mood and affect normal.  Vitals reviewed.     No results found for this or any previous visit (from the past 48 hour(s)). No results found.  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 Scott, up since her last colonoscopy in 2010 so relatively short or rapid growth.. I'm recommending right colon resection. I have discussed with her the rationale for surgery the options of observation risk of  bleeding infection and anastomotic leak and recurrence as well as ostomy she understood and agreed to proceed  Florene Glen, MD, FACS

## 2016-04-20 NOTE — Patient Instructions (Addendum)
We have seen you today to speak about removing a portion of your damaged Colon. We will arrange for this surgery to be done on 05/06/16 at [facility] by Dr. Burt Knack.  You will need to complete a bowel prep prior to your surgery, please see the information sheet provided today for your directions.  Also, there will be 2 different antibiotics that you will need to take the day of your bowel prep: Neomycin and Erythromycin. You will take 2 tablets of each medication 3 times on the day of your bowel prep- 8am, 2pm, and 8pm.  Please see the (blue) Pre-care sheet for the details about your scheduled surgery.

## 2016-04-20 NOTE — Addendum Note (Signed)
Addended by: Celene Kras on: 04/20/2016 10:59 AM   Modules accepted: Orders

## 2016-04-21 ENCOUNTER — Encounter: Payer: Self-pay | Admitting: *Deleted

## 2016-04-21 ENCOUNTER — Telehealth: Payer: Self-pay | Admitting: Surgery

## 2016-04-21 NOTE — Telephone Encounter (Signed)
Pt advised of pre op date/time and sx date. Sx: 05/06/16 with Dr Charm Barges colon resection.  Pre op: 04/26/16 @ 10:30am--Office.   Patient made aware to call [phone], between 1-3:00pm the day before surgery, to find out what time to arrive.    Bowel Prep directions have been given to the patient in the office the day of visit.

## 2016-04-22 ENCOUNTER — Encounter: Payer: Self-pay | Admitting: Surgery

## 2016-04-26 ENCOUNTER — Encounter: Payer: Self-pay | Admitting: *Deleted

## 2016-04-26 ENCOUNTER — Encounter
Admission: RE | Admit: 2016-04-26 | Discharge: 2016-04-26 | Disposition: A | Discharge status: Home / Self Care | Payer: BLUE CROSS/BLUE SHIELD | Source: Ambulatory Visit | Attending: Surgery | Admitting: Surgery

## 2016-04-26 DIAGNOSIS — Z01812 Encounter for preprocedural laboratory examination: Secondary | ICD-10-CM | POA: Insufficient documentation

## 2016-04-26 DIAGNOSIS — Z01810 Encounter for preprocedural cardiovascular examination: Secondary | ICD-10-CM | POA: Diagnosis present

## 2016-04-26 LAB — COMPREHENSIVE METABOLIC PANEL
ALBUMIN: 4.1 g/dL (ref 3.5–5.0)
ALT: 11 U/L — ABNORMAL LOW (ref 14–54)
AST: 16 U/L (ref 15–41)
Alkaline Phosphatase: 47 U/L (ref 38–126)
Anion gap: 7 (ref 5–15)
BUN: 11 mg/dL (ref 6–20)
CHLORIDE: 107 mmol/L (ref 101–111)
CO2: 26 mmol/L (ref 22–32)
Calcium: 9.1 mg/dL (ref 8.9–10.3)
Creatinine, Ser: 0.99 mg/dL (ref 0.44–1.00)
GFR calc Af Amer: >60 mL/min (ref >60)
Glucose, Bld: 92 mg/dL (ref 65–99)
POTASSIUM: 3.7 mmol/L (ref 3.5–5.1)
SODIUM: 140 mmol/L (ref 135–145)
Total Bilirubin: 0.3 mg/dL (ref 0.3–1.2)
Total Protein: 7.1 g/dL (ref 6.5–8.1)

## 2016-04-26 LAB — CBC WITH DIFFERENTIAL/PLATELET
BASOS ABS: 0 10*3/uL (ref 0–0.1)
BASOS PCT: 0 %
EOS ABS: 0.1 10*3/uL (ref 0–0.7)
EOS PCT: 1 %
HCT: 41.3 % (ref 35.0–47.0)
Hemoglobin: 14.6 g/dL (ref 12.0–16.0)
Lymphocytes Relative: 38 %
Lymphs Abs: 2.5 10*3/uL (ref 1.0–3.6)
MCH: 31.5 pg (ref 26.0–34.0)
MCHC: 35.4 g/dL (ref 32.0–36.0)
MCV: 89.2 fL (ref 80.0–100.0)
MONO ABS: 0.5 10*3/uL (ref 0.2–0.9)
Monocytes Relative: 8 %
Neutro Abs: 3.6 10*3/uL (ref 1.4–6.5)
Neutrophils Relative %: 53 %
PLATELETS: 216 10*3/uL (ref 150–440)
RBC: 4.63 MIL/uL (ref 3.80–5.20)
RDW: 14 % (ref 11.5–14.5)
WBC: 6.7 10*3/uL (ref 3.6–11.0)

## 2016-04-26 LAB — MRSA PCR SCREENING: MRSA BY PCR: NEGATIVE

## 2016-04-26 NOTE — Telephone Encounter (Signed)
error 

## 2016-04-26 NOTE — Patient Instructions (Signed)
Your procedure is scheduled on: 05-06-2016 Su procedimiento est programado para: Report to [facility] SECOND FLOOR  To find out your arrival time please call [phone] between 1PM - 3PM on 05-05-2106   Remember: Instructions that are not followed completely may result in serious medical risk, up to and including death, or upon the discretion of your surgeon and anesthesiologist your surgery may need to be rescheduled.  Recuerde: Las instrucciones que no se siguen completamente Heritage manager en un riesgo de salud grave, incluyendo hasta la [place] o a discrecin de su cirujano y Environmental health practitioner, su ciruga se puede posponer.   _X__ 1. Do not eat food or drink liquids after midnight. No gum chewing or hard candies.  No coma alimentos ni tome lquidos despus de la medianoche.  No mastique chicle ni caramelos  duros.     __X__ 2. No alcohol for 24 hours before or after surgery.    No tome alcohol durante las 24 horas antes ni despus de la [place].   ____ 3. Bring all medications with you on the day of surgery if instructed.    Lleve todos los medicamentos con usted el da de su ciruga si se le ha indicado as.   ___X_ 4. Notify your doctor if there is any change in your medical condition (cold, fever,                             infections).    Informe a su mdico si hay algn cambio en su condicin mdica (resfriado, fiebre, infecciones).   Do not wear jewelry, make-up, hairpins, clips or nail polish.  No use joyas, maquillajes, pinzas/ganchos para el cabello ni esmalte de uas.  Do not wear lotions, powders, or perfumes. You may wear deodorant.  No use lociones, polvos o perfumes.  Puede usar desodorante.    Do not shave 48 hours prior to surgery. Men may shave face and neck.  No se afeite 48 horas antes de la [place].  Los hombres pueden [employer] cara y el cuello.   Do not bring valuables to the hospital.   No lleve objetos [place] is not  responsible for any belongings or valuables.  [place] no se hace responsable de ningn tipo de pertenencias u objetos de Geographical information systems officer.               Contacts, dentures or bridgework may not be worn into surgery.  Los lentes de [place], las dentaduras postizas o puentes no se pueden usar en la [place].  Leave your suitcase in the car. After surgery it may be brought to your room.  Deje su maleta en el auto.  Despus de la ciruga podr traerla a su habitacin.  For patients admitted to the hospital, discharge time is determined by your treatment team.  Para los pacientes que sean ingresados al hospital, el tiempo en el cual se le dar de alta es determinado por su                equipo de [place].   Patients discharged the day of surgery will not be allowed to drive home. A los pacientes que se les da de alta el mismo da de la ciruga no se les permitir conducir a Holiday representative.   Please read over the following fact sheets that you were given: Por favor [place] de informacin que le dieron:   MRSA Information  __X__ Take these medicines the morning of surgery with A SIP OF WATER:          [employer] estas medicinas la maana de la ciruga con UN SORBO DE AGUA:  1.NORVASC  2.LORAZEPAM IF NEEDED   3.   4.       5.  6.  ____ Fleet Enema (as directed)          Enema de Fleet (segn lo indicado)    ____ Use CHG Soap as directed          Utilice el jabn de CHG segn lo indicado  ____ Use inhalers on the day of surgery          Use los inhaladores el da de la ciruga  ____ Stop metformin 2 days prior to surgery          Deje de tomar el metformin 2 das antes de la ciruga    ____ Take 1/2 of usual insulin dose the night before surgery and none on the morning of surgery           Tome la mitad de la dosis habitual de insulina la noche antes de la [place] y no tome nada en la maana de la             ciruga  ____ Stop Coumadin/Plavix/aspirin on          Deje de tomar el  Coumadin/Plavix/aspirina el da:  __X__ Stop Anti-inflammatories on 04-29-2016 USE TYLENOL           Deje de tomar antiinflamatorios el da:   __X__ Stop supplements until after surgery ON 04-29-2016          Deje de tomar suplementos hasta despus de la ciruga  ____ Bring C-Pap to the hospital          [place] [facility] hospital

## 2016-04-27 ENCOUNTER — Ambulatory Visit: Payer: Self-pay | Admitting: General Surgery

## 2016-04-29 ENCOUNTER — Telehealth: Payer: Self-pay

## 2016-04-29 NOTE — Telephone Encounter (Signed)
Patient is going to have a Right Colon Resection with Dr. Burt Knack on May 06, 2016. Her Human Resource has the disability form to be filled out. The patient wants to know if the disability form can be filled out now or if she has to wait after her surgery. A good contact number for the patient is [phone]

## 2016-04-30 NOTE — Telephone Encounter (Signed)
Called patient back and told her that I would fill out and fax her disability form after she has her surgery. I told patient that she was more than welcome to drop off her disability form. Patient understood and agreed on dropping off form on Monday 05/03/2016 at our [place] office.

## 2016-05-06 ENCOUNTER — Encounter: Payer: Self-pay | Admitting: *Deleted

## 2016-05-06 ENCOUNTER — Inpatient Hospital Stay
Admission: RE | Admit: 2016-05-06 | Discharge: 2016-05-12 | DRG: 331 | Disposition: A | Discharge status: Home / Self Care | Payer: BLUE CROSS/BLUE SHIELD | Source: Ambulatory Visit | Attending: Surgery | Admitting: Surgery

## 2016-05-06 ENCOUNTER — Inpatient Hospital Stay: Payer: BLUE CROSS/BLUE SHIELD | Admitting: Anesthesiology

## 2016-05-06 ENCOUNTER — Encounter
Admission: RE | Disposition: A | Discharge status: Home / Self Care | Payer: Self-pay | Source: Ambulatory Visit | Attending: Surgery

## 2016-05-06 DIAGNOSIS — Z860100 Personal history of colon polyps, unspecified: Secondary | ICD-10-CM

## 2016-05-06 DIAGNOSIS — Z841 Family history of disorders of kidney and ureter: Secondary | ICD-10-CM

## 2016-05-06 DIAGNOSIS — F17210 Nicotine dependence, cigarettes, uncomplicated: Secondary | ICD-10-CM | POA: Diagnosis present

## 2016-05-06 DIAGNOSIS — Z23 Encounter for immunization: Secondary | ICD-10-CM | POA: Diagnosis not present

## 2016-05-06 DIAGNOSIS — D120 Benign neoplasm of cecum: Principal | ICD-10-CM | POA: Diagnosis present

## 2016-05-06 DIAGNOSIS — K635 Polyp of colon: Secondary | ICD-10-CM | POA: Diagnosis present

## 2016-05-06 DIAGNOSIS — I10 Essential (primary) hypertension: Secondary | ICD-10-CM | POA: Diagnosis present

## 2016-05-06 DIAGNOSIS — Z823 Family history of stroke: Secondary | ICD-10-CM

## 2016-05-06 DIAGNOSIS — Z4659 Encounter for fitting and adjustment of other gastrointestinal appliance and device: Secondary | ICD-10-CM

## 2016-05-06 DIAGNOSIS — J449 Chronic obstructive pulmonary disease, unspecified: Secondary | ICD-10-CM | POA: Diagnosis present

## 2016-05-06 HISTORY — PX: COLOSTOMY REVISION: SHX5232

## 2016-05-06 SURGERY — COLECTOMY, RIGHT
Anesthesia: General | Laterality: Right | Wound class: Clean Contaminated

## 2016-05-06 MED ORDER — ACETAMINOPHEN 10 MG/ML IV SOLN
INTRAVENOUS | Status: AC
  Filled 2016-05-06: qty 100

## 2016-05-06 MED ORDER — ONDANSETRON HCL 4 MG PO TABS: 4.0000 mg | ORAL_TABLET | Freq: Four times a day (QID) | ORAL | Status: DC | PRN

## 2016-05-06 MED ORDER — OXYCODONE HCL 5 MG/5ML PO SOLN: 5.0000 mg | Freq: Once | ORAL | Status: DC | PRN

## 2016-05-06 MED ORDER — OXYCODONE HCL 5 MG PO TABS: 5.0000 mg | ORAL_TABLET | Freq: Once | ORAL | Status: DC | PRN

## 2016-05-06 MED ORDER — SODIUM CHLORIDE 0.9% FLUSH: 9.0000 mL | INTRAVENOUS | Status: DC | PRN

## 2016-05-06 MED ORDER — PROPOFOL 10 MG/ML IV BOLUS
INTRAVENOUS | Status: DC | PRN
  Administered 2016-05-06: 50 mg via INTRAVENOUS
  Administered 2016-05-06: 150 mg via INTRAVENOUS

## 2016-05-06 MED ORDER — HEPARIN SODIUM (PORCINE) 5000 UNIT/ML IJ SOLN
INTRAMUSCULAR | Status: AC
  Administered 2016-05-06: 5000 [IU] via SUBCUTANEOUS
  Filled 2016-05-06: qty 1

## 2016-05-06 MED ORDER — DIPHENHYDRAMINE HCL 12.5 MG/5ML PO ELIX
12.5000 mg | ORAL_SOLUTION | Freq: Four times a day (QID) | ORAL | Status: DC | PRN
  Filled 2016-05-06: qty 5

## 2016-05-06 MED ORDER — FAMOTIDINE 20 MG PO TABS
20.0000 mg | ORAL_TABLET | Freq: Once | ORAL | Status: AC
  Administered 2016-05-06: 20 mg via ORAL

## 2016-05-06 MED ORDER — KCL IN DEXTROSE-NACL 20-5-0.45 MEQ/L-%-% IV SOLN
INTRAVENOUS | Status: DC
  Administered 2016-05-06 – 2016-05-10 (×8): via INTRAVENOUS
  Filled 2016-05-06 (×14): qty 1000

## 2016-05-06 MED ORDER — LIDOCAINE HCL (CARDIAC) 20 MG/ML IV SOLN
INTRAVENOUS | Status: DC | PRN
  Administered 2016-05-06: 80 mg via INTRAVENOUS

## 2016-05-06 MED ORDER — DEXAMETHASONE SODIUM PHOSPHATE 10 MG/ML IJ SOLN
INTRAMUSCULAR | Status: DC | PRN
  Administered 2016-05-06: 8 mg via INTRAVENOUS

## 2016-05-06 MED ORDER — CHLORHEXIDINE GLUCONATE 4 % EX LIQD: 60.0000 mL | Freq: Once | CUTANEOUS | Status: DC

## 2016-05-06 MED ORDER — ROCURONIUM BROMIDE 100 MG/10ML IV SOLN
INTRAVENOUS | Status: DC | PRN
  Administered 2016-05-06: 10 mg via INTRAVENOUS
  Administered 2016-05-06: 40 mg via INTRAVENOUS
  Administered 2016-05-06: 5 mg via INTRAVENOUS

## 2016-05-06 MED ORDER — FENTANYL CITRATE (PF) 100 MCG/2ML IJ SOLN
INTRAMUSCULAR | Status: AC
  Administered 2016-05-06: 50 ug via INTRAVENOUS
  Filled 2016-05-06: qty 2

## 2016-05-06 MED ORDER — HYDROMORPHONE 1 MG/ML IV SOLN
INTRAVENOUS | Status: DC
Start: 2016-05-06 — End: 2016-05-08
  Administered 2016-05-06: 13:00:00 via INTRAVENOUS
  Administered 2016-05-06: 0.03 mg via INTRAVENOUS
  Administered 2016-05-06: 1.1 mg via INTRAVENOUS
  Administered 2016-05-07: 0.3 mg via INTRAVENOUS
  Administered 2016-05-07 (×2): 0 mg via INTRAVENOUS
  Administered 2016-05-07: 1.8 mg via INTRAVENOUS
  Administered 2016-05-07: 0.3 mg via INTRAVENOUS
  Administered 2016-05-08: 0 mg via INTRAVENOUS
  Administered 2016-05-08: 0.9 mg via INTRAVENOUS
  Administered 2016-05-08: 0 mg via INTRAVENOUS
  Administered 2016-05-08: 0.6 mg via INTRAVENOUS
  Administered 2016-05-08: 0 mg via INTRAVENOUS
  Filled 2016-05-06: qty 25

## 2016-05-06 MED ORDER — MIDAZOLAM HCL 2 MG/2ML IJ SOLN
INTRAMUSCULAR | Status: DC | PRN
Start: 2016-05-06 — End: 2016-05-06
  Administered 2016-05-06: 2 mg via INTRAVENOUS

## 2016-05-06 MED ORDER — NALOXONE HCL 0.4 MG/ML IJ SOLN
0.4000 mg | INTRAMUSCULAR | Status: DC | PRN
  Filled 2016-05-06: qty 1

## 2016-05-06 MED ORDER — AMLODIPINE BESYLATE 10 MG PO TABS
10.0000 mg | ORAL_TABLET | Freq: Every day | ORAL | Status: DC
  Administered 2016-05-07 – 2016-05-12 (×5): 10 mg via ORAL
  Filled 2016-05-06 (×5): qty 1

## 2016-05-06 MED ORDER — SUGAMMADEX SODIUM 200 MG/2ML IV SOLN
INTRAVENOUS | Status: DC | PRN
  Administered 2016-05-06: 140.6 mg via INTRAVENOUS

## 2016-05-06 MED ORDER — PNEUMOCOCCAL VAC POLYVALENT 25 MCG/0.5ML IJ INJ
0.5000 mL | INJECTION | INTRAMUSCULAR | Status: AC
  Administered 2016-05-08: 0.5 mL via INTRAMUSCULAR
  Filled 2016-05-06: qty 0.5

## 2016-05-06 MED ORDER — ACETAMINOPHEN 10 MG/ML IV SOLN
INTRAVENOUS | Status: DC | PRN
  Administered 2016-05-06: 1000 mg via INTRAVENOUS

## 2016-05-06 MED ORDER — DIPHENHYDRAMINE HCL 50 MG/ML IJ SOLN: 12.5000 mg | Freq: Four times a day (QID) | INTRAMUSCULAR | Status: DC | PRN

## 2016-05-06 MED ORDER — FENTANYL CITRATE (PF) 100 MCG/2ML IJ SOLN
25.0000 ug | INTRAMUSCULAR | Status: DC | PRN
  Administered 2016-05-06 (×2): 50 ug via INTRAVENOUS

## 2016-05-06 MED ORDER — SODIUM CHLORIDE 0.9 % IV SOLN
1.0000 g | INTRAVENOUS | Status: AC
  Administered 2016-05-06: 1 g via INTRAVENOUS
  Filled 2016-05-06: qty 1

## 2016-05-06 MED ORDER — ONDANSETRON HCL 4 MG/2ML IJ SOLN: 4.0000 mg | Freq: Four times a day (QID) | INTRAMUSCULAR | Status: DC | PRN

## 2016-05-06 MED ORDER — HEPARIN SODIUM (PORCINE) 5000 UNIT/ML IJ SOLN
5000.0000 [IU] | Freq: Once | INTRAMUSCULAR | Status: AC
  Administered 2016-05-06: 5000 [IU] via SUBCUTANEOUS

## 2016-05-06 MED ORDER — FENTANYL CITRATE (PF) 100 MCG/2ML IJ SOLN
INTRAMUSCULAR | Status: DC | PRN
  Administered 2016-05-06: 100 ug via INTRAVENOUS
  Administered 2016-05-06 (×6): 50 ug via INTRAVENOUS

## 2016-05-06 MED ORDER — ONDANSETRON HCL 4 MG/2ML IJ SOLN
INTRAMUSCULAR | Status: DC | PRN
  Administered 2016-05-06: 4 mg via INTRAVENOUS

## 2016-05-06 MED ORDER — FAMOTIDINE 20 MG PO TABS
ORAL_TABLET | ORAL | Status: AC
  Administered 2016-05-06: 20 mg via ORAL
  Filled 2016-05-06: qty 1

## 2016-05-06 MED ORDER — HEPARIN SODIUM (PORCINE) 5000 UNIT/ML IJ SOLN
5000.0000 [IU] | Freq: Three times a day (TID) | INTRAMUSCULAR | Status: DC
  Administered 2016-05-06 – 2016-05-12 (×17): 5000 [IU] via SUBCUTANEOUS
  Filled 2016-05-06 (×17): qty 1

## 2016-05-06 MED ORDER — ONDANSETRON HCL 4 MG/2ML IJ SOLN
4.0000 mg | Freq: Four times a day (QID) | INTRAMUSCULAR | Status: DC | PRN
Start: 2016-05-06 — End: 2016-05-08

## 2016-05-06 MED ORDER — LACTATED RINGERS IV SOLN
INTRAVENOUS | Status: DC
  Administered 2016-05-06 (×2): via INTRAVENOUS

## 2016-05-06 MED ORDER — BUPIVACAINE-EPINEPHRINE (PF) 0.25% -1:200000 IJ SOLN
INTRAMUSCULAR | Status: AC
  Filled 2016-05-06: qty 30

## 2016-05-06 MED ORDER — BUPIVACAINE-EPINEPHRINE (PF) 0.25% -1:200000 IJ SOLN
INTRAMUSCULAR | Status: DC | PRN
  Administered 2016-05-06: 30 mL via PERINEURAL

## 2016-05-06 SURGICAL SUPPLY — 57 items
ADH LQ OCL WTPRF AMP STRL LF (MISCELLANEOUS) ×1
ADHESIVE MASTISOL STRL (MISCELLANEOUS) ×2 IMPLANT
BRR ADH 6X5 SEPRAFILM 1 SHT (MISCELLANEOUS)
CANISTER SUCT 1200ML W/VALVE (MISCELLANEOUS) ×2 IMPLANT
CATH TRAY 16F METER LATEX (MISCELLANEOUS) ×2 IMPLANT
CHLORAPREP W/TINT 26ML (MISCELLANEOUS) ×2 IMPLANT
COVER CLAMP SIL LG PBX B (MISCELLANEOUS) IMPLANT
DRAPE LAPAROTOMY 100X77 ABD (DRAPES) ×2 IMPLANT
DRAPE LEGGINS SURG 28X43 STRL (DRAPES) IMPLANT
DRSG OPSITE POSTOP 4X10 (GAUZE/BANDAGES/DRESSINGS) ×1 IMPLANT
DRSG OPSITE POSTOP 4X8 (GAUZE/BANDAGES/DRESSINGS) ×3 IMPLANT
DRSG TELFA 3X8 NADH (GAUZE/BANDAGES/DRESSINGS) ×2 IMPLANT
ELECT CAUTERY BLADE 6.4 (BLADE) ×2 IMPLANT
ELECT REM PT RETURN 9FT ADLT (ELECTROSURGICAL) ×2
ELECTRODE REM PT RTRN 9FT ADLT (ELECTROSURGICAL) ×1 IMPLANT
GAUZE SPONGE 4X4 12PLY STRL (GAUZE/BANDAGES/DRESSINGS) IMPLANT
GLOVE BIO SURGEON STRL SZ8 (GLOVE) ×8 IMPLANT
GLOVE INDICATOR 8.0 STRL GRN (GLOVE) ×8 IMPLANT
GOWN STRL REUS W/ TWL LRG LVL3 (GOWN DISPOSABLE) ×4 IMPLANT
GOWN STRL REUS W/ TWL XL LVL3 (GOWN DISPOSABLE) ×2 IMPLANT
GOWN STRL REUS W/TWL LRG LVL3 (GOWN DISPOSABLE) ×12
GOWN STRL REUS W/TWL XL LVL3 (GOWN DISPOSABLE) ×4
HANDLE YANKAUER SUCT BULB TIP (MISCELLANEOUS) ×1 IMPLANT
KIT CATH CVC 3 LUMEN 7FR 8IN (MISCELLANEOUS) ×2 IMPLANT
KIT RM TURNOVER STRD PROC AR (KITS) ×2 IMPLANT
LABEL OR SOLS (LABEL) ×1 IMPLANT
NDL SAFETY 22GX1.5 (NEEDLE) ×2 IMPLANT
NS IRRIG 1000ML POUR BTL (IV SOLUTION) ×3 IMPLANT
NS IRRIG 500ML POUR BTL (IV SOLUTION) ×1 IMPLANT
PACK BASIN MAJOR ARMC (MISCELLANEOUS) ×2 IMPLANT
PACK COLON CLEAN CLOSURE (MISCELLANEOUS) ×2 IMPLANT
PAD DRESSING TELFA 3X8 NADH (GAUZE/BANDAGES/DRESSINGS) ×1 IMPLANT
RELOAD PROXIMATE 30MM BLUE (ENDOMECHANICALS) IMPLANT
RELOAD PROXIMATE 75MM BLUE (ENDOMECHANICALS) ×2 IMPLANT
RELOAD STAPLE 30 3.6 BLU REG (ENDOMECHANICALS) IMPLANT
RELOAD STAPLE 75 3.8 BLU REG (ENDOMECHANICALS) IMPLANT
RELOAD STAPLER LINEAR PROX 30 (STAPLE) IMPLANT
SEPRAFILM MEMBRANE 5X6 (MISCELLANEOUS) IMPLANT
SET YANKAUER POOLE SUCT (MISCELLANEOUS) ×2 IMPLANT
SPONGE LAP 18X18 5 PK (GAUZE/BANDAGES/DRESSINGS) ×2 IMPLANT
SPONGE XRAY 4X4 16PLY STRL (MISCELLANEOUS) ×1 IMPLANT
STAPLER PROXIMATE 75MM BLUE (STAPLE) ×1 IMPLANT
STAPLER RELOAD LINEAR PROX 30 (STAPLE)
STAPLER RELOADABLE 30 BLU REG (STAPLE) IMPLANT
STAPLER SKIN PROX 35W (STAPLE) ×2 IMPLANT
STRIP CLOSURE SKIN 1/2X4 (GAUZE/BANDAGES/DRESSINGS) ×8 IMPLANT
SUT MNCRL 3-0 UNDYED SH (SUTURE) ×2 IMPLANT
SUT MON AB 3-0 SH 27 (SUTURE) ×2 IMPLANT
SUT MONOCRYL 3-0 UNDYED (SUTURE) ×2
SUT PDS AB 1 CT1 27 (SUTURE) ×3 IMPLANT
SUT PDS AB 1 TP1 54 (SUTURE) ×3 IMPLANT
SUT SILK 0 (SUTURE) ×2
SUT SILK 0 30XBRD TIE 6 (SUTURE) ×1 IMPLANT
SUT SILK 3-0 (SUTURE) ×12 IMPLANT
SUT VIC AB 1 CTX 27 (SUTURE) ×3 IMPLANT
SUT VICRYL 0 TIES 12 18 (SUTURE) ×4 IMPLANT
SYRINGE 10CC LL (SYRINGE) ×2 IMPLANT

## 2016-05-06 NOTE — Progress Notes (Signed)
Entered on wrong patient

## 2016-05-06 NOTE — Anesthesia Preprocedure Evaluation (Signed)
Anesthesia Evaluation  Patient identified by MRN, date of birth, ID band Patient awake    Reviewed: Allergy & Precautions, H&P , NPO status , Patient's Chart, lab work & pertinent test results  History of Anesthesia Complications Negative for: history of anesthetic complications  Airway Mallampati: III  TM Distance: >3 FB Neck ROM: limited    Dental  (+) Poor Dentition, Chipped, Missing, Upper Dentures   Pulmonary neg shortness of breath, COPD, Current Smoker,    Pulmonary exam normal breath sounds clear to auscultation       Cardiovascular Exercise Tolerance: Good hypertension, (-) angina(-) Past MI and (-) DOE Normal cardiovascular exam Rhythm:regular Rate:Normal     Neuro/Psych PSYCHIATRIC DISORDERS Anxiety Depression negative neurological ROS     GI/Hepatic Neg liver ROS, GERD  Controlled,  Endo/Other  negative endocrine ROS  Renal/GU negative Renal ROS  negative genitourinary   Musculoskeletal  (+) Arthritis ,   Abdominal   Peds  Hematology negative hematology ROS (+)   Anesthesia Other Findings Past Medical History: No date: Anxiety No date: Arthritis No date: Back pain No date: Depression No date: GERD (gastroesophageal reflux disease) No date: Hypertension No date: Polycystic disease, ovaries No date: Vitamin D deficiency  Past Surgical History: No date: ABDOMINAL HYSTERECTOMY No date: BACK SURGERY     Comment: cervical fusion No date: carpel tunnel 04/02/2016: COLONOSCOPY WITH PROPOFOL N/A     Comment: Procedure: COLONOSCOPY WITH PROPOFOL;                Surgeon: Lollie Sails, MD;  Location: [facility]              ENDOSCOPY;  Service: Endoscopy;  Laterality:               N/A; No date: PARTIAL HYSTERECTOMY No date: TUBAL LIGATION     Reproductive/Obstetrics negative OB ROS                             Anesthesia Physical Anesthesia Plan  ASA: III  Anesthesia  Plan: General ETT   Post-op Pain Management:    Induction:   Airway Management Planned:   Additional Equipment:   Intra-op Plan:   Post-operative Plan:   Informed Consent: I have reviewed the patients History and Physical, chart, labs and discussed the procedure including the risks, benefits and alternatives for the proposed anesthesia with the patient or authorized representative who has indicated his/her understanding and acceptance.   Dental Advisory Given  Plan Discussed with: Anesthesiologist, CRNA and Surgeon  Anesthesia Plan Comments:         Anesthesia Quick Evaluation

## 2016-05-06 NOTE — Transfer of Care (Signed)
Immediate Anesthesia Transfer of Care Note  Patient: Paula Spencer  Procedure(s) Performed: Procedure(s): COLON RESECTION RIGHT (Right)  Patient Location: PACU  Anesthesia Type:General  Level of Consciousness: awake and alert   Airway & Oxygen Therapy: Patient Spontanous Breathing and Patient connected to face mask oxygen  Post-op Assessment: Report given to RN and Post -op Vital signs reviewed and stable  Post vital signs: Reviewed and stable  Last Vitals:  Vitals:   05/06/16 0735 05/06/16 1042  BP: 140/84 127/79  Pulse: 76 72  Resp: 18 15  Temp: 36.8 C 37.3 C    Last Pain:  Vitals:   05/06/16 0735  TempSrc: Oral         Complications: No apparent anesthesia complications

## 2016-05-06 NOTE — Op Note (Signed)
   Pre-operative Diagnosis: Cecal polyp  Post-operative Diagnosis: Same  Surgeon: Phoebe Perch   Assistants: Marta Lamas  Anesthesia: Gen. with endotracheal tube   Surgeon: Jerrol Banana. Burt Knack, MD FACS  Anesthesia: Gen. with endotracheal tube  Procedure: Right colon resection with ileocolonic anastomosis  Procedure Details  The patient was seen again in the Holding Room. The benefits, complications, treatment options, and expected outcomes were discussed with the patient. The risks of bleeding, infection, recurrence of symptoms, failure to resolve symptoms,  bowel injury, any of which could require further surgery were reviewed with the patient.   The patient was taken to Operating Room, identified as Paula Spencer and the procedure verified.  A Time Out was held and the above information confirmed.  Prior to the induction of general anesthesia, antibiotic prophylaxis was administered. VTE prophylaxis was in place. General endotracheal anesthesia was then administered and tolerated well. After the induction, the abdomen was prepped with Chloraprep and draped in the sterile fashion. The patient was positioned in the supine position. A Foley catheter was placed as was a nasogastric tube by anesthesia.  A transverse incision was utilized to open and explore the abdominal cavity there was no sign of palpable mass in the liver there were some adhesions which were taken down sharply most of these were in the pelvis. The cecal polyp was identified and palpable. The right colon was elevated by incising the avascular line laterally and identifying the ureter. Ultimately the hepatic flexure was taken down and division with GIA at the hepatic flexure and at the ileocecal valve was performed the mesentery was divided between clamps and tied with 0 silk ligatures.  Re-anastomosis was performed after ensuring that there was no bleeding and the abdominal cavity. This anastomosis was performed in a  handsewn fashion utilizing inner layer of 3-0 Monocryl and 3-0 silk as an outer layer. Leeah Politano lines and staple lines were inverted. The rent in the mesentery was closed with figure-of-eight 3-0 silk's to prevent internal herniation.  The abdominal cavity was reinspected to ensure no bleeding or anastomotic leak and that the tissue was viable and then irrigation was performed.  Gowns were changed as were instruments and then the wound was closed with running #1 PDS Marcaine was placed in the skin and subcutaneous tissues tissues and then staples were placed and a sterile dressing was placed  Sponge lap needle count was correct this may blood loss was 50 cc or less. Patient hour to the procedure well the workup occasion she was taken to recovery room in stable condition to be admitted for continued care  Findings: Palpable cecal polyp soft in nature. No other findings some adhesions no palpable liver masses   Estimated Blood Loss: 50 cc         Drains: None         Specimens: Right colon         Complications: None                  Condition: Stable   Colyn Miron E. Burt Knack, MD, FACS

## 2016-05-06 NOTE — Anesthesia Postprocedure Evaluation (Signed)
Anesthesia Post Note  Patient: Paula Spencer  Procedure(s) Performed: Procedure(s) (LRB): COLON RESECTION RIGHT (Right)  Patient location during evaluation: PACU Anesthesia Type: General Level of consciousness: awake Pain management: satisfactory to patient Vital Signs Assessment: post-procedure vital signs reviewed and stable Respiratory status: nonlabored ventilation Cardiovascular status: stable Anesthetic complications: no    Last Vitals:  Vitals:   05/06/16 0735 05/06/16 1042  BP: 140/84 127/79  Pulse: 76 72  Resp: 18 15  Temp: 36.8 C 37.3 C    Last Pain:  Vitals:   05/06/16 1042  TempSrc:   PainSc: 0-No pain                 VAN STAVEREN,Jamere Stidham

## 2016-05-06 NOTE — Anesthesia Procedure Notes (Signed)
Procedure Name: Intubation Performed by: Maylon Sailors Pre-anesthesia Checklist: Patient identified, Patient being monitored, Timeout performed, Emergency Drugs available and Suction available Patient Re-evaluated:Patient Re-evaluated prior to inductionOxygen Delivery Method: Circle system utilized Preoxygenation: Pre-oxygenation with 100% oxygen Intubation Type: IV induction Ventilation: Mask ventilation without difficulty Laryngoscope Size: Mac and 3 Grade View: Grade I Tube type: Oral Tube size: 7.0 mm Number of attempts: 1 Airway Equipment and Method: Stylet Placement Confirmation: ETT inserted through vocal cords under direct vision,  positive ETCO2 and breath sounds checked- equal and bilateral Secured at: 21 cm Tube secured with: Tape Dental Injury: Teeth and Oropharynx as per pre-operative assessment        

## 2016-05-06 NOTE — Progress Notes (Signed)
Preoperative Review   Patient is met in the preoperative holding area. The history is reviewed in the chart and with the patient. I personally reviewed the options and rationale as well as the risks of this procedure that have been previously discussed with the patient. All questions asked by the patient and/or family were answered to their satisfaction.  Patient agrees to proceed with this procedure at this time.  Richard E Cooper M.D. FACS  

## 2016-05-07 LAB — BASIC METABOLIC PANEL
ANION GAP: 9 (ref 5–15)
BUN: 7 mg/dL (ref 6–20)
CHLORIDE: 106 mmol/L (ref 101–111)
CO2: 23 mmol/L (ref 22–32)
Calcium: 8.5 mg/dL — ABNORMAL LOW (ref 8.9–10.3)
Creatinine, Ser: 0.89 mg/dL (ref 0.44–1.00)
GFR calc non Af Amer: >60 mL/min (ref >60)
GLUCOSE: 127 mg/dL — AB (ref 65–99)
POTASSIUM: 3.8 mmol/L (ref 3.5–5.1)
Sodium: 138 mmol/L (ref 135–145)

## 2016-05-07 LAB — CBC
HEMATOCRIT: 40.2 % (ref 35.0–47.0)
HEMOGLOBIN: 14.1 g/dL (ref 12.0–16.0)
MCH: 31 pg (ref 26.0–34.0)
MCHC: 35 g/dL (ref 32.0–36.0)
MCV: 88.6 fL (ref 80.0–100.0)
Platelets: 207 10*3/uL (ref 150–440)
RBC: 4.54 MIL/uL (ref 3.80–5.20)
RDW: 13.7 % (ref 11.5–14.5)
WBC: 13.5 10*3/uL — ABNORMAL HIGH (ref 3.6–11.0)

## 2016-05-07 LAB — SURGICAL PATHOLOGY

## 2016-05-07 NOTE — Progress Notes (Signed)
1 Day Post-Op  Subjective: S/p rt colon resection Moderate pain PCA currently disconnected but to be reconnected by nursing shortly  Objective: Vital signs in last 24 hours: Temp:  [97.7 F (36.5 C)-99.2 F (37.3 C)] 99.2 F (37.3 C) (08/11 0522) Pulse Rate:  [69-93] 87 (08/11 0522) Resp:  [12-23] 23 (08/11 0825) BP: (111-143)/(72-83) 139/77 (08/11 0522) SpO2:  [90 %-100 %] 97 % (08/11 0825) Weight:  [152 lb 8.9 oz (69.2 kg)] 152 lb 8.9 oz (69.2 kg) (08/10 1112) Last BM Date: 05/03/16  Intake/Output from previous day: 08/10 0701 - 08/11 0700 In: 2987.1 [I.V.:2972.1; NG/GT:15] Out: 1490 [Urine:1290; Emesis/NG output:150; Blood:50] Intake/Output this shift: Total I/O In: -  Out: 350 [Urine:300; Emesis/NG output:50]  Physical exam:  Wound is dressed and clean. Nontender calves  Lab Results: CBC   Recent Labs  05/07/16 0542  WBC 13.5*  HGB 14.1  HCT 40.2  PLT 207   BMET  Recent Labs  05/07/16 0542  NA 138  K 3.8  CL 106  CO2 23  GLUCOSE 127*  BUN 7  CREATININE 0.89  CALCIUM 8.5*   PT/INR No results for input(s): LABPROT, INR in the last 72 hours. ABG No results for input(s): PHART, HCO3 in the last 72 hours.  Invalid input(s): PCO2, PO2  Studies/Results: No results found.  Anti-infectives: Anti-infectives    Start     Dose/Rate Route Frequency Ordered Stop   05/06/16 0638  ertapenem [facility]) 1 g in sodium chloride 0.9 % 50 mL IVPB     1 g 100 mL/hr over 30 Minutes Intravenous On call to O.R. 05/06/16 [id number] 05/06/16 0915      Assessment/Plan: s/p Procedure(s): COLON RESECTION RIGHT   Postop day 1 from right colon resection PCA to be reconnected for patient's pain control issues currently she is doing well will continue nasogastric tube  Florene Glen, MD, FACS  05/07/2016

## 2016-05-08 MED ORDER — MORPHINE SULFATE (PF) 2 MG/ML IV SOLN
2.0000 mg | INTRAVENOUS | Status: DC | PRN
  Administered 2016-05-09: 2 mg via INTRAVENOUS
  Filled 2016-05-08: qty 1

## 2016-05-08 NOTE — Progress Notes (Signed)
2 Days Post-Op  Subjective: Status post right colon resection. Patient feels well except she is complaining of her nasogastric tube and wants it removed. No flatus  Objective: Vital signs in last 24 hours: Temp:  [98.3 F (36.8 C)-99.7 F (37.6 C)] 98.9 F (37.2 C) (08/12 0120) Pulse Rate:  [92-101] 92 (08/12 0120) Resp:  [16-26] 20 (08/12 0800) BP: (137-152)/(82-86) 137/82 (08/12 0120) SpO2:  [95 %-98 %] 96 % (08/12 0800) FiO2 (%):  [0 %-96 %] 96 % (08/12 0800) Last BM Date: 05/05/16  Intake/Output from previous day: 08/11 0701 - 08/12 0700 In: 948 [I.V.:723; NG/GT:225] Out: 1150 [Urine:900; Emesis/NG output:250] Intake/Output this shift: No intake/output data recorded.  Physical exam:  Abdomen is soft and minimally tender wound is clean no erythema no drainage calves are nontender  Lab Results: CBC   Recent Labs  05/07/16 0542  WBC 13.5*  HGB 14.1  HCT 40.2  PLT 207   BMET  Recent Labs  05/07/16 0542  NA 138  K 3.8  CL 106  CO2 23  GLUCOSE 127*  BUN 7  CREATININE 0.89  CALCIUM 8.5*   PT/INR No results for input(s): LABPROT, INR in the last 72 hours. ABG No results for input(s): PHART, HCO3 in the last 72 hours.  Invalid input(s): PCO2, PO2  Studies/Results: No results found.  Anti-infectives: Anti-infectives    Start     Dose/Rate Route Frequency Ordered Stop   05/06/16 0638  ertapenem [facility]) 1 g in sodium chloride 0.9 % 50 mL IVPB     1 g 100 mL/hr over 30 Minutes Intravenous On call to O.R. 05/06/16 [id number] 05/06/16 0915      Assessment/Plan: s/p Procedure(s): COLON RESECTION RIGHT   Patient doing well postop from right colon resection nasogastric tube needs remain in place until flatus occurs and then can advance diet.  Florene Glen, MD, FACS  05/08/2016

## 2016-05-08 NOTE — Progress Notes (Signed)
Patient called me to room stating that she was "hallucinating". She stated that the TV was on the floor and she was on the ceiling. Dr. Burt Knack notified and PCA discontinued. 2 mg Morphine q2 prn ordered for pain. Will continue to monitor.

## 2016-05-09 ENCOUNTER — Inpatient Hospital Stay: Payer: BLUE CROSS/BLUE SHIELD

## 2016-05-09 MED ORDER — LORAZEPAM 2 MG/ML IJ SOLN
INTRAMUSCULAR | Status: AC
  Administered 2016-05-09: 0.5 mg via INTRAVENOUS
  Filled 2016-05-09: qty 1

## 2016-05-09 MED ORDER — LORAZEPAM 2 MG/ML IJ SOLN
0.5000 mg | Freq: Once | INTRAMUSCULAR | Status: AC
  Administered 2016-05-09: 0.5 mg via INTRAVENOUS

## 2016-05-09 NOTE — Progress Notes (Signed)
Called Dr. Dahlia Byes regarding order for abdominal x ray.  Patient's NG tube accidentally came out while she slept.  New NG tube was placed in right nare (16 FR).  Order was placed for x ray to verify correct placement.  Paula Spencer  05/09/2016  6:49 AM

## 2016-05-09 NOTE — Progress Notes (Signed)
Patient called me into room and stated "to get her down from up here". She felt as if she was laying on the ceiling. Dr. Burt Knack notified and 0.5 mg Ativan x1 ordered.

## 2016-05-09 NOTE — Progress Notes (Signed)
Called Dr. Dahlia Byes regarding patient request to have NG tube removed.  Doctor said that it has to remain because patient has no flatus yet.  Paula Spencer N  05/09/2016 1:02 AM

## 2016-05-09 NOTE — Progress Notes (Signed)
3 Days Post-Op  Subjective: Status post right colon resection for polyp. Pathology was discussed with the patient showing no sign of cancer. She wants her nasogastric tube out in fact pulled out last night and it was replaced. No flatus yet Objective: Vital signs in last 24 hours: Temp:  [98.7 F (37.1 C)-99.1 F (37.3 C)] 99.1 F (37.3 C) (08/12 2019) Pulse Rate:  [84-89] 84 (08/12 2019) Resp:  [16-23] 16 (08/12 2019) BP: (134-138)/(71-82) 134/79 (08/12 2019) SpO2:  [89 %-99 %] 99 % (08/12 2019) FiO2 (%):  [91 %] 91 % (08/12 1200) Last BM Date: 05/06/16  Intake/Output from previous day: 08/12 0701 - 08/13 0700 In: 1747 [I.V.:1747] Out: 2000 [Urine:1650; Emesis/NG output:350] Intake/Output this shift: Total I/O In: 776 [I.V.:776] Out: 200 [Urine:200]  Physical exam:  Soft nontender abdomen wound is dressed.  Lab Results: CBC   Recent Labs  05/07/16 0542  WBC 13.5*  HGB 14.1  HCT 40.2  PLT 207   BMET  Recent Labs  05/07/16 0542  NA 138  K 3.8  CL 106  CO2 23  GLUCOSE 127*  BUN 7  CREATININE 0.89  CALCIUM 8.5*   PT/INR No results for input(s): LABPROT, INR in the last 72 hours. ABG No results for input(s): PHART, HCO3 in the last 72 hours.  Invalid input(s): PCO2, PO2  Studies/Results: Dg Abd Portable 1v  Result Date: 05/09/2016 CLINICAL DATA:  Nasogastric tube placement.  Initial encounter. EXAM: PORTABLE ABDOMEN - 1 VIEW COMPARISON:  CT of the abdomen and pelvis from 09/08/2014 FINDINGS: The patient's enteric tube is seen ending overlying the antrum of the stomach. The visualized bowel gas pattern is unremarkable. Scattered air-filled loops of small and large bowel are seen; no abnormal dilatation of small bowel loops is seen to suggest small bowel obstruction. No free intra-abdominal air is identified, though evaluation for free air is limited on a single supine view. Skin staples are noted along the right lower quadrant. The visualized osseous  structures are within normal limits; the sacroiliac joints are unremarkable in appearance. IMPRESSION: Enteric tube noted ending overlying the antrum of the stomach. Electronically Signed   By: Garald Balding M.D.   On: 05/09/2016 07:28    Anti-infectives: Anti-infectives    Start     Dose/Rate Route Frequency Ordered Stop   05/06/16 0638  ertapenem [facility]) 1 g in sodium chloride 0.9 % 50 mL IVPB     1 g 100 mL/hr over 30 Minutes Intravenous On call to O.R. 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  Florene Glen, MD, FACS  05/09/2016

## 2016-05-10 LAB — BASIC METABOLIC PANEL
Anion gap: 7 (ref 5–15)
BUN: 9 mg/dL (ref 6–20)
CHLORIDE: 100 mmol/L — AB (ref 101–111)
CO2: 34 mmol/L — ABNORMAL HIGH (ref 22–32)
Calcium: 9.1 mg/dL (ref 8.9–10.3)
Creatinine, Ser: 0.92 mg/dL (ref 0.44–1.00)
GFR calc Af Amer: >60 mL/min (ref >60)
GFR calc non Af Amer: >60 mL/min (ref >60)
GLUCOSE: 130 mg/dL — AB (ref 65–99)
POTASSIUM: 3.7 mmol/L (ref 3.5–5.1)
Sodium: 141 mmol/L (ref 135–145)

## 2016-05-10 LAB — CBC
HEMATOCRIT: 42.1 % (ref 35.0–47.0)
Hemoglobin: 14.6 g/dL (ref 12.0–16.0)
MCH: 30.6 pg (ref 26.0–34.0)
MCHC: 34.7 g/dL (ref 32.0–36.0)
MCV: 88.3 fL (ref 80.0–100.0)
Platelets: 238 10*3/uL (ref 150–440)
RBC: 4.77 MIL/uL (ref 3.80–5.20)
RDW: 13.5 % (ref 11.5–14.5)
WBC: 7.4 10*3/uL (ref 3.6–11.0)

## 2016-05-10 MED ORDER — ALUM & MAG HYDROXIDE-SIMETH 200-200-20 MG/5ML PO SUSP
15.0000 mL | Freq: Four times a day (QID) | ORAL | Status: DC | PRN
  Administered 2016-05-10: 15 mL via ORAL
  Filled 2016-05-10: qty 30

## 2016-05-10 NOTE — Progress Notes (Signed)
Subjective:   She is feeling much better today. Her pain is under reasonable control. She's been passing gas most of the day. She has no nausea and her nasogastric output has dropped dramatically. Her labs remained stable. She did have some questions about her pathology.  Vital signs in last 24 hours: Temp:  [98.4 F (36.9 C)-99.3 F (37.4 C)] 99.3 F (37.4 C) (08/14 1432) Pulse Rate:  [87-90] 90 (08/14 1432) Resp:  [16-17] 17 (08/14 1432) BP: (124-147)/(63-87) 124/63 (08/14 1432) SpO2:  [95 %-96 %] 96 % (08/14 1432) Last BM Date:  (piror to admission)  Intake/Output from previous day: 08/13 0701 - 08/14 0700 In: 2883 [I.V.:2883] Out: [id number] [Urine:1150; Emesis/NG output:2325]  Exam:  Her abdomen is soft. Her dressing was removed and her wound looks good. She has active bowel sounds. No sign of any infection and she has no significant abdominal distention.  Lab Results:  CBC  Recent Labs  05/10/16 0449  WBC 7.4  HGB 14.6  HCT 42.1  PLT 238   CMP     Component Value Date/Time   NA 141 05/10/2016 0449   K 3.7 05/10/2016 0449   CL 100 (L) 05/10/2016 0449   CO2 34 (H) 05/10/2016 0449   GLUCOSE 130 (H) 05/10/2016 0449   BUN 9 05/10/2016 0449   CREATININE 0.92 05/10/2016 0449   CALCIUM 9.1 05/10/2016 0449   PROT 7.1 04/26/2016 1105   ALBUMIN 4.1 04/26/2016 1105   AST 16 04/26/2016 1105   ALT 11 (L) 04/26/2016 1105   ALKPHOS 47 04/26/2016 1105   BILITOT 0.3 04/26/2016 1105   GFRNONAA >60 05/10/2016 0449   GFRAA >60 05/10/2016 0449   PT/INR No results for input(s): LABPROT, INR in the last 72 hours.  Studies/Results: Dg Abd Portable 1v  Result Date: 05/09/2016 CLINICAL DATA:  Nasogastric tube placement.  Initial encounter. EXAM: PORTABLE ABDOMEN - 1 VIEW COMPARISON:  CT of the abdomen and pelvis from 09/08/2014 FINDINGS: The patient's enteric tube is seen ending overlying the antrum of the stomach. The visualized bowel gas pattern is unremarkable. Scattered air-filled  loops of small and large bowel are seen; no abnormal dilatation of small bowel loops is seen to suggest small bowel obstruction. No free intra-abdominal air is identified, though evaluation for free air is limited on a single supine view. Skin staples are noted along the right lower quadrant. The visualized osseous structures are within normal limits; the sacroiliac joints are unremarkable in appearance. IMPRESSION: Enteric tube noted ending overlying the antrum of the stomach. Electronically Signed   By: Garald Balding M.D.   On: 05/09/2016 07:28    Assessment/Plan: She seems to have improved her bowel function. We will pick a nasogastric tube see how she does overnight likely start liquids later tonight or in the morning. She's making good progress. We'll increase her activity which is able to tolerate a diet we'll plan for discharge home. She is in agreement

## 2016-05-11 MED ORDER — HYDROCODONE-ACETAMINOPHEN 5-325 MG PO TABS
1.0000 | ORAL_TABLET | Freq: Four times a day (QID) | ORAL | Status: DC | PRN
  Administered 2016-05-11: 2 via ORAL
  Administered 2016-05-11: 1 via ORAL
  Administered 2016-05-12: 2 via ORAL
  Filled 2016-05-11: qty 1
  Filled 2016-05-11 (×2): qty 2

## 2016-05-11 NOTE — Progress Notes (Signed)
Subjective:   She continues to improve. She has had some bowel function today. She is not nauseated and tolerating clear liquid diet. She really does not have any complaints.  Vital signs in last 24 hours: Temp:  [98.3 F (36.8 C)-99.3 F (37.4 C)] 98.3 F (36.8 C) (08/15 0526) Pulse Rate:  [84-94] 84 (08/15 0526) Resp:  [16-17] 17 (08/15 0526) BP: (109-124)/(63-78) 109/68 (08/15 0526) SpO2:  [94 %-96 %] 94 % (08/15 0526) Last BM Date: 05/05/16  Intake/Output from previous day: 08/14 0701 - 08/15 0700 In: 1664 [I.V.:1664] Out: 1475 [Urine:325; Emesis/NG output:1150]  Exam:  Her abdomen is soft. Her wound looks good. There is no sign of any drainage.  Lab Results:  CBC  Recent Labs  05/10/16 0449  WBC 7.4  HGB 14.6  HCT 42.1  PLT 238   CMP     Component Value Date/Time   NA 141 05/10/2016 0449   K 3.7 05/10/2016 0449   CL 100 (L) 05/10/2016 0449   CO2 34 (H) 05/10/2016 0449   GLUCOSE 130 (H) 05/10/2016 0449   BUN 9 05/10/2016 0449   CREATININE 0.92 05/10/2016 0449   CALCIUM 9.1 05/10/2016 0449   PROT 7.1 04/26/2016 1105   ALBUMIN 4.1 04/26/2016 1105   AST 16 04/26/2016 1105   ALT 11 (L) 04/26/2016 1105   ALKPHOS 47 04/26/2016 1105   BILITOT 0.3 04/26/2016 1105   GFRNONAA >60 05/10/2016 0449   GFRAA >60 05/10/2016 0449   PT/INR No results for input(s): LABPROT, INR in the last 72 hours.  Studies/Results: No results found.  Assessment/Plan: Overall she's doing well. We will increase her diet and activity level. I anticipate discharge in next 24-48 hours.

## 2016-05-12 MED ORDER — HYDROCODONE-ACETAMINOPHEN 5-325 MG PO TABS: 1.0000 | ORAL_TABLET | Freq: Four times a day (QID) | ORAL | 0 refills | Status: DC | PRN

## 2016-05-12 NOTE — Discharge Planning (Signed)
Patient IV removed.  DC papers given, explained and educated.  No FU appts needed at this time. Pain script given.  DC checklist completed.  RN assessment and VS revealed stability for DC to home and family will be transporting home via car once ready.

## 2016-05-12 NOTE — Final Progress Note (Signed)
Subjective:   She feels much better today. She is up ambulating tolerating a diet without difficulty. Her pain is under good control.  Vital signs in last 24 hours: Temp:  [98.2 F (36.8 C)-98.9 F (37.2 C)] 98.5 F (36.9 C) (08/16 0605) Pulse Rate:  [74-90] 81 (08/16 0605) Resp:  [16-18] 18 (08/16 0605) BP: (100-130)/(57-74) 130/74 (08/16 0605) SpO2:  [90 %-96 %] 90 % (08/16 0605) Last BM Date: 05/11/16  Intake/Output from previous day: 08/15 0701 - 08/16 0700 In: 512 [P.O.:240; I.V.:272] Out: 600 [Urine:600]  Exam:  Her abdomen looks good. There is no sign of any infection. Her wound is clear. She has active bowel sounds.  Lab Results:  CBC  Recent Labs  05/10/16 0449  WBC 7.4  HGB 14.6  HCT 42.1  PLT 238   CMP     Component Value Date/Time   NA 141 05/10/2016 0449   K 3.7 05/10/2016 0449   CL 100 (L) 05/10/2016 0449   CO2 34 (H) 05/10/2016 0449   GLUCOSE 130 (H) 05/10/2016 0449   BUN 9 05/10/2016 0449   CREATININE 0.92 05/10/2016 0449   CALCIUM 9.1 05/10/2016 0449   PROT 7.1 04/26/2016 1105   ALBUMIN 4.1 04/26/2016 1105   AST 16 04/26/2016 1105   ALT 11 (L) 04/26/2016 1105   ALKPHOS 47 04/26/2016 1105   BILITOT 0.3 04/26/2016 1105   GFRNONAA >60 05/10/2016 0449   GFRAA >60 05/10/2016 0449   PT/INR No results for input(s): LABPROT, INR in the last 72 hours.  Studies/Results: No results found.  Assessment/Plan: We will plan discharge today follow-up in the office next week. She is in agreement with this plan.

## 2016-05-13 ENCOUNTER — Telehealth: Payer: Self-pay | Admitting: Surgery

## 2016-05-13 ENCOUNTER — Telehealth: Payer: Self-pay

## 2016-05-13 NOTE — Telephone Encounter (Signed)
Call returned to patient at this time. Patient states that she is not having pain but she is having soreness. She states that when she is trying to sit straight up, cough, sneeze, or laugh she is having soreness in her abdomen. I explained to her that she would need to reposition herself to lie on her left side and walk her way up with her hands to keep her from sitting straight up. Also, using a pillow while coughing, sneezing, or laughing will help with this pain.  She has been taking Norco as needed for pain but does not like to take this medication. I did express that she may take 1/2 of a tablet or even Ibuprofen for soreness if whole tablet is too strong.  Patient verbalizes understanding and is encouraged to call back with any further questions.  She is scheduled for her post-op appointment on Tuesday, 05/18/16.

## 2016-05-13 NOTE — Telephone Encounter (Signed)
Patient was discharged yesterday. They removed part of her colon and reattached it. She is sore today and feels like it may be gas build up. If there is something she can get she will pick it up at [facility] clinic on [address].

## 2016-05-13 NOTE — Telephone Encounter (Signed)
Patient's FMLA form was filled out. Patient's sister will come in and pick up her forms.

## 2016-05-18 ENCOUNTER — Ambulatory Visit (INDEPENDENT_AMBULATORY_CARE_PROVIDER_SITE_OTHER): Payer: BLUE CROSS/BLUE SHIELD | Admitting: General Surgery

## 2016-05-18 ENCOUNTER — Encounter: Payer: Self-pay | Admitting: General Surgery

## 2016-05-18 VITALS — BP 136/82 | HR 70 | Temp 98.1°F | Ht 66.0 in | Wt 150.0 lb

## 2016-05-18 DIAGNOSIS — Z4889 Encounter for other specified surgical aftercare: Secondary | ICD-10-CM

## 2016-05-18 MED ORDER — HYDROCODONE-ACETAMINOPHEN 10-325 MG PO TABS: 1.0000 | ORAL_TABLET | Freq: Four times a day (QID) | ORAL | 0 refills | Status: DC | PRN

## 2016-05-18 MED ORDER — HYDROCODONE-ACETAMINOPHEN 5-325 MG PO TABS: 1.0000 | ORAL_TABLET | Freq: Four times a day (QID) | ORAL | 0 refills | Status: AC | PRN

## 2016-05-18 NOTE — Patient Instructions (Signed)
Please see your follow up appointment listed below. Please call our office if you have any questions or concerns.

## 2016-05-18 NOTE — Progress Notes (Signed)
Outpatient Surgical Follow Up  05/18/2016  Paula Spencer is an 60 y.o. female.   Chief Complaint  Patient presents with  . Routine Post Op    Colon Resection-05/06/16 Dr.Cooper    HPI: 60 year old female returns to clinic 2 weeks status post a right hemicolectomy. Patient reports having significant soreness in her lower abdomen and down the outer aspects of bilateral thighs. She states that otherwise she is been doing well. She's been eating well and having normal bowel function. She denies any fevers, chills, nausea, vomiting, chest pain, shortness of breath, diarrhea, constipation. Her pain has been well-controlled with the as needed pain medications.  Past Medical History:  Diagnosis Date  . Anxiety   . Arthritis   . Back pain   . Depression   . GERD (gastroesophageal reflux disease)   . Hypertension   . Polycystic disease, ovaries   . Vitamin D deficiency     Past Surgical History:  Procedure Laterality Date  . ABDOMINAL HYSTERECTOMY    . BACK SURGERY     cervical fusion  . carpel tunnel    . COLONOSCOPY WITH PROPOFOL N/A 04/02/2016   Procedure: COLONOSCOPY WITH PROPOFOL;  Surgeon: Lollie Sails, MD;  Location: [facility] ENDOSCOPY;  Service: Endoscopy;  Laterality: N/A;  . COLOSTOMY REVISION Right 05/06/2016   Procedure: COLON RESECTION RIGHT;  Surgeon: Florene Glen, MD;  Location: [facility] ORS;  Service: General;  Laterality: Right;  . PARTIAL HYSTERECTOMY    . TUBAL LIGATION      Family History  Problem Relation Age of Onset  . Stroke Mother   . Kidney disease Mother   . Cancer Father   . Aneurysm Brother     Social History:  reports that she has been smoking Cigarettes.  She has a 7.50 pack-year smoking history. She has never used smokeless tobacco. She reports that she does not drink alcohol or use drugs.  Allergies:  Allergies  Allergen Reactions  . Nsaids Other (See Comments)  . Skelaxin [Metaxalone] Other (See Comments)    Medications  reviewed.    ROS A multipoint review of systems was completed, all pertinent positives and negatives were documented within the history of present illness and remainder are negative.   BP 136/82 (BP Location: Right Arm, Patient Position: Sitting, Cuff Size: Normal)   Pulse 70   Temp 98.1 F (36.7 C) (Oral)   Ht 5\' 6"  (1.676 m)   Wt 68 kg (150 lb)   BMI 24.21 kg/m   Physical Exam  Gen.: No acute distress Chest: Clear to auscultation Heart: Regular rhythm Abdomen: Soft, appropriately tender to palpation at incision sites, nondistended. Staples in place with skin well approximated to a low transverse right lower quadrant incision. No evidence of erythema or drainage.   No results found for this or any previous visit (from the past 48 hour(s)). No results found.  Assessment/Plan:  1. Aftercare following surgery 60 year old female status post right colectomy. Doing well, pain medications refilled. Pathology reviewed with the patient. Staples removed and replaced with Steri-Strips. All questions answered to her satisfaction and she will follow-up in clinic with Dr. Burt Knack next week.     Clayburn Pert, MD FACS General Surgeon  05/18/2016,10:33 AM

## 2016-05-25 ENCOUNTER — Encounter: Payer: Self-pay | Admitting: Surgery

## 2016-05-25 ENCOUNTER — Ambulatory Visit (INDEPENDENT_AMBULATORY_CARE_PROVIDER_SITE_OTHER): Payer: BLUE CROSS/BLUE SHIELD | Admitting: Surgery

## 2016-05-25 VITALS — BP 147/78 | HR 72 | Temp 98.7°F | Ht 66.0 in | Wt 150.5 lb

## 2016-05-25 DIAGNOSIS — D374 Neoplasm of uncertain behavior of colon: Secondary | ICD-10-CM

## 2016-05-25 NOTE — Progress Notes (Signed)
Outpatient postop visit  05/25/2016  Paula Spencer is an 60 y.o. female.    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 She describes minimal abdominal pain she is eating better having better normal bowel movements. She's had no fevers or chills and is overall improving. She also states she has bilateral leg pain from her lateral knees down without swelling she is able to walk without difficulty.  Medications reviewed.    Physical Exam:  BP (!) 147/78 (BP Location: Right Arm, Patient Position: Sitting, Cuff Size: Normal)   Pulse 72   Temp 98.7 F (37.1 C) (Oral)   Ht 5\' 6"  (1.676 m)   Wt 150 lb 8 oz (68.3 kg)   BMI 24.29 kg/m     PE: Patient is examined standing and supine wound is clean no erythema minimal drainage from a 1 mm area near the midline but no erythema. Abdomen is otherwise soft and nontender.  Bilateral lower extremities are nontender no calf tenderness no cords negative Homans sign and no edema.    Assessment/Plan:  Leg pain of unclear etiology possibly related to an increase in walking following surgery.  Her abdominal exam is doing well and she is returning towards normal. She works in a [employer] and is not ready to go back to work yet as expected. I suggested that she wait another 2 weeks because she cannot do any heavy lifting until then anyway and we can see her back if needed in 2 weeks.  Florene Glen, MD, FACS

## 2016-05-25 NOTE — Patient Instructions (Signed)
Please call our office if you have any questions or concerns. If you feel that you are not ready to return to work in 2 weeks please call our office and let us know.

## 2016-05-28 ENCOUNTER — Encounter: Payer: Self-pay | Admitting: Surgery

## 2016-05-28 ENCOUNTER — Telehealth: Payer: Self-pay

## 2016-05-28 NOTE — Telephone Encounter (Signed)
See note provided

## 2016-06-03 ENCOUNTER — Other Ambulatory Visit: Payer: Self-pay | Admitting: Family Medicine

## 2016-06-03 DIAGNOSIS — Z1231 Encounter for screening mammogram for malignant neoplasm of breast: Secondary | ICD-10-CM

## 2016-06-07 ENCOUNTER — Telehealth: Payer: Self-pay | Admitting: Surgery

## 2016-06-07 NOTE — Telephone Encounter (Signed)
Patient had colon resection with Dr Burt Knack on 05/06/16. She states she has seen blood in her stools over the weekend and is concerned. She would like to speak with nurse regarding this. *(She has a post op appointment with Dr Adonis Huguenin Wednesday 9/13 at 9:45am)*

## 2016-06-07 NOTE — Telephone Encounter (Signed)
Patient states at this time that she had seen a very small amount of blood after having a bowel movement. She said her stool was a little hard then softened up. She stated she strained some while having the bowel movement.  She denies fever, or pain different from what she has complained of already. She states she has continued to have pain at her navel and has had this since her surgery. We discussed hemorrhoids and she didn't know if she has them or not. She was instructed to follow up with her appointment on Wednesday. She also stated she may be getting depressed and was told to mention this to  Dr. Adonis Huguenin. She was instructed to go to the ER if she passed more blood, has a fever, or increased pain. Patient verbalized understanding.

## 2016-06-09 ENCOUNTER — Emergency Department: Payer: BLUE CROSS/BLUE SHIELD

## 2016-06-09 ENCOUNTER — Emergency Department
Admission: EM | Admit: 2016-06-09 | Discharge: 2016-06-09 | Disposition: A | Discharge status: Home / Self Care | Payer: BLUE CROSS/BLUE SHIELD | Attending: Emergency Medicine | Admitting: Emergency Medicine

## 2016-06-09 ENCOUNTER — Encounter: Payer: Self-pay | Admitting: General Surgery

## 2016-06-09 ENCOUNTER — Ambulatory Visit (INDEPENDENT_AMBULATORY_CARE_PROVIDER_SITE_OTHER): Payer: BLUE CROSS/BLUE SHIELD | Admitting: General Surgery

## 2016-06-09 VITALS — BP 146/90 | HR 91 | Temp 99.2°F | Resp 20 | Ht 66.0 in | Wt 152.8 lb

## 2016-06-09 DIAGNOSIS — M79606 Pain in leg, unspecified: Secondary | ICD-10-CM

## 2016-06-09 DIAGNOSIS — Z79899 Other long term (current) drug therapy: Secondary | ICD-10-CM | POA: Insufficient documentation

## 2016-06-09 DIAGNOSIS — M79604 Pain in right leg: Secondary | ICD-10-CM | POA: Diagnosis present

## 2016-06-09 DIAGNOSIS — Z4889 Encounter for other specified surgical aftercare: Secondary | ICD-10-CM

## 2016-06-09 DIAGNOSIS — I10 Essential (primary) hypertension: Secondary | ICD-10-CM | POA: Diagnosis not present

## 2016-06-09 DIAGNOSIS — M79661 Pain in right lower leg: Secondary | ICD-10-CM

## 2016-06-09 DIAGNOSIS — F329 Major depressive disorder, single episode, unspecified: Secondary | ICD-10-CM | POA: Diagnosis not present

## 2016-06-09 DIAGNOSIS — M79605 Pain in left leg: Secondary | ICD-10-CM | POA: Insufficient documentation

## 2016-06-09 DIAGNOSIS — F4325 Adjustment disorder with mixed disturbance of emotions and conduct: Secondary | ICD-10-CM

## 2016-06-09 DIAGNOSIS — M79662 Pain in left lower leg: Secondary | ICD-10-CM

## 2016-06-09 DIAGNOSIS — F17210 Nicotine dependence, cigarettes, uncomplicated: Secondary | ICD-10-CM | POA: Diagnosis not present

## 2016-06-09 DIAGNOSIS — R52 Pain, unspecified: Secondary | ICD-10-CM

## 2016-06-09 DIAGNOSIS — R45851 Suicidal ideations: Secondary | ICD-10-CM | POA: Diagnosis not present

## 2016-06-09 DIAGNOSIS — F32A Depression, unspecified: Secondary | ICD-10-CM

## 2016-06-09 LAB — COMPREHENSIVE METABOLIC PANEL
ALT: 14 U/L (ref 14–54)
AST: 16 U/L (ref 15–41)
Albumin: 4.4 g/dL (ref 3.5–5.0)
Alkaline Phosphatase: 49 U/L (ref 38–126)
Anion gap: 5 (ref 5–15)
BILIRUBIN TOTAL: 0.5 mg/dL (ref 0.3–1.2)
BUN: 9 mg/dL (ref 6–20)
CO2: 29 mmol/L (ref 22–32)
CREATININE: 0.95 mg/dL (ref 0.44–1.00)
Calcium: 9.5 mg/dL (ref 8.9–10.3)
Chloride: 106 mmol/L (ref 101–111)
Glucose, Bld: 101 mg/dL — ABNORMAL HIGH (ref 65–99)
POTASSIUM: 4 mmol/L (ref 3.5–5.1)
Sodium: 140 mmol/L (ref 135–145)
TOTAL PROTEIN: 7.7 g/dL (ref 6.5–8.1)

## 2016-06-09 LAB — CBC
HEMATOCRIT: 43.2 % (ref 35.0–47.0)
Hemoglobin: 14.5 g/dL (ref 12.0–16.0)
MCH: 29.8 pg (ref 26.0–34.0)
MCHC: 33.7 g/dL (ref 32.0–36.0)
MCV: 88.5 fL (ref 80.0–100.0)
PLATELETS: 250 10*3/uL (ref 150–440)
RBC: 4.88 MIL/uL (ref 3.80–5.20)
RDW: 14.3 % (ref 11.5–14.5)
WBC: 6.6 10*3/uL (ref 3.6–11.0)

## 2016-06-09 LAB — URINALYSIS COMPLETE WITH MICROSCOPIC (ARMC ONLY)
BILIRUBIN URINE: NEGATIVE
Bacteria, UA: NONE SEEN
Glucose, UA: NEGATIVE mg/dL
HGB URINE DIPSTICK: NEGATIVE
KETONES UR: NEGATIVE mg/dL
LEUKOCYTES UA: NEGATIVE
NITRITE: NEGATIVE
PH: 8 (ref 5.0–8.0)
Protein, ur: NEGATIVE mg/dL
RBC / HPF: NONE SEEN RBC/hpf (ref 0–5)
SPECIFIC GRAVITY, URINE: 1.009 (ref 1.005–1.030)

## 2016-06-09 LAB — LIPASE, BLOOD: Lipase: 31 U/L (ref 11–51)

## 2016-06-09 NOTE — Progress Notes (Signed)
Outpatient Surgical Follow Up  06/09/2016  Paula Spencer is an 60 y.o. female.   Chief Complaint  Patient presents with  . Routine Post Op    Colon Resection (05/06/16)- Dr. Burt Knack    HPI: 60 year old female returns to clinic for follow-up approximately one month status post open hemicolectomy. Patient reports that since she was last seen she's had decreasing energy and increasing depression. Patient stated to me this morning that she was starting to feel suicidal. She did not stated plan. She also states that she's had increasing work of breathing and tenderness to bilateral posterior calves. The area around her incision site especially at her umbilicus is much more tender in the pain medication she states her no longer helping. She did pass blood per rectum twice in the last 2 weeks but states that this has stopped and she's not seen in the last 2 days. She states it was bright red blood on the toilet paper and did not fill the bowl. She states she's had a decreased appetite but denies any nausea or vomiting. She denies any fevers, chills, diarrhea, constipation.  Past Medical History:  Diagnosis Date  . Anxiety   . Arthritis   . Back pain   . Depression   . GERD (gastroesophageal reflux disease)   . Hypertension   . Polycystic disease, ovaries   . Vitamin D deficiency     Past Surgical History:  Procedure Laterality Date  . ABDOMINAL HYSTERECTOMY    . BACK SURGERY     cervical fusion  . carpel tunnel    . COLONOSCOPY WITH PROPOFOL N/A 04/02/2016   Procedure: COLONOSCOPY WITH PROPOFOL;  Surgeon: Lollie Sails, MD;  Location: [facility] ENDOSCOPY;  Service: Endoscopy;  Laterality: N/A;  . COLOSTOMY REVISION Right 05/06/2016   Procedure: COLON RESECTION RIGHT;  Surgeon: Florene Glen, MD;  Location: [facility] ORS;  Service: General;  Laterality: Right;  . PARTIAL HYSTERECTOMY    . TUBAL LIGATION      Family History  Problem Relation Age of Onset  . Stroke Mother   . Kidney  disease Mother   . Cancer Father   . Aneurysm Brother     Social History:  reports that she has been smoking Cigarettes.  She has a 7.50 pack-year smoking history. She has never used smokeless tobacco. She reports that she does not drink alcohol or use drugs.  Allergies:  Allergies  Allergen Reactions  . Nsaids Other (See Comments)  . Skelaxin [Metaxalone] Other (See Comments)    Medications reviewed.    ROS Multipoint review of systems as documented in the history of present illness and the remainder negative.   BP (!) 146/90   Pulse 91   Temp 99.2 F (37.3 C) (Oral)   Resp 20   Ht 5\' 6"  (1.676 m)   Wt 69.3 kg (152 lb 12.8 oz)   SpO2 98% Comment: Room Air  BMI 24.66 kg/m   Physical Exam Gen.: No acute distress Neck: Supple and nontender Chest: Clear to auscultation with equal wall motion Heart: Regular rate and rhythm Abdomen: Soft, tender to palpation along the lower abdominal incision. Patient has active guarding around her umbilicus. No evidence of erythema or purulent drainage Extremities: Bilateral posterior calves are tender Psych: Patient voices suicidal thoughts    No results found for this or any previous visit (from the past 17 hour(s)). No results found.  Assessment/Plan:  1. Aftercare following surgery 60 year old female one month status post open hemicolectomy. Continues to  have incisional pain. She will continue to need pain medications as tolerated. Encourage ambulation, oral intake. Patient being taken to the emergency department for #3. I will let her operative surgeon, Dr. Burt Knack, who is on-call today no severe he can follow-up on her labs.  2. Bilateral calf pain Patient with bilateral calf pain in the postoperative setting. She'll need ultrasounds to rule out DVT. If no evidence of DVT would worry about electrolyte imbalances causing myalgias.  3. Suicidal ideation Patient voiced suicidal ideation. Given her multiple medical problems and  concerns for suicidal ideation discussed with the patient and her nurse that should be taken immediately to the emergency room for observation and workup of her other problems. Discussed this with the patient who voiced understanding.     Clayburn Pert, MD FACS General Surgeon  06/09/2016,10:42 AM

## 2016-06-09 NOTE — ED Triage Notes (Signed)
Pt sent from Dr Burt Knack office with c/o abd pain post colon resection 8/10, with BL calf pain and pt states she is depressed with the surgery, a friend just passed away on 01/19/2023.Paula Spencer

## 2016-06-09 NOTE — ED Provider Notes (Signed)
Patient received in signout from Dr. Kerman Passey.  Results of lower extremity duplex negative for acute DVT. Patient reassessed and remains hemodynamic stable. Abdominal exam is soft and benign. There was also concern for underlying depression but patient denies any SI or HI. Patient stable for outpatient follow-up.  Have discussed with the patient and available family all diagnostics and treatments performed thus far and all questions were answered to the best of my ability. The patient demonstrates understanding and agreement with plan.    Merlyn Lot, MD 06/09/16 [phone]

## 2016-06-09 NOTE — ED Provider Notes (Signed)
[facility] Emergency Department Provider Note  Time seen: 2:30 PM  I have reviewed the triage vital signs and the nursing notes.   HISTORY  Chief Complaint Post-op Problem; Leg Pain; and Depression    HPI Paula Spencer is a 60 y.o. female with a past medical history of arthritis, back pain, depression, hypertension who presents the emergency department for lower extremity pain and depression. Patient recently had a partial colon resection performed by Dr. Burt Knack. She was at her scheduled follow-up appointment. She is noted to have continued incisional pain which has been largely unchanged since the surgery. Patient is also complaining of bilateral calf pain for the past one week. During her office visit today with Dr. Adonis Huguenin, the patient noted increased depression with possible suicidal thoughts per surgery note. They recommended the patient come to the ER for further evaluation. Here the patient does admit to depression but states she would absolutely never hurt herself nor she threatened to hurt herself. Patient states a friend of hers recently passed away which has worsened her underlying depression that again states no SI or HI.  Past Medical History:  Diagnosis Date  . Anxiety   . Arthritis   . Back pain   . Depression   . GERD (gastroesophageal reflux disease)   . Hypertension   . Polycystic disease, ovaries   . Vitamin D deficiency     Patient Active Problem List   Diagnosis Date Noted  . Colon polyp 05/06/2016  . History of colonic polyps     Past Surgical History:  Procedure Laterality Date  . ABDOMINAL HYSTERECTOMY    . BACK SURGERY     cervical fusion  . carpel tunnel    . COLONOSCOPY WITH PROPOFOL N/A 04/02/2016   Procedure: COLONOSCOPY WITH PROPOFOL;  Surgeon: Lollie Sails, MD;  Location: [facility] ENDOSCOPY;  Service: Endoscopy;  Laterality: N/A;  . COLOSTOMY REVISION Right 05/06/2016   Procedure: COLON RESECTION RIGHT;  Surgeon:  Florene Glen, MD;  Location: [facility] ORS;  Service: General;  Laterality: Right;  . PARTIAL HYSTERECTOMY    . TUBAL LIGATION      Prior to Admission medications   Medication Sig Start Date End Date Taking? Authorizing Provider  amLODipine (NORVASC) 10 MG tablet Take 1 tablet by mouth daily. 08/28/14   Historical Provider, MD  Cholecalciferol (VITAMIN D PO) Take 1 tablet by mouth daily.    Historical Provider, MD  DOCOSAHEXAENOIC ACID PO Take 1 tablet by mouth 1 day or 1 dose.    Historical Provider, MD  LORazepam (ATIVAN) 1 MG tablet Take 1 mg by mouth 3 (three) times daily as needed for anxiety.    Historical Provider, MD    Allergies  Allergen Reactions  . Nsaids Other (See Comments)  . Skelaxin [Metaxalone] Other (See Comments)    Family History  Problem Relation Age of Onset  . Stroke Mother   . Kidney disease Mother   . Cancer Father   . Aneurysm Brother     Social History Social History  Substance Use Topics  . Smoking status: Current Every Day Smoker    Packs/day: 0.25    Years: 30.00    Types: Cigarettes  . Smokeless tobacco: Never Used  . Alcohol use No    Review of Systems Constitutional: Negative for fever. Cardiovascular: Negative for chest pain. Respiratory: Negative for shortness of breath. Gastrointestinal: Negative for abdominal pain Musculoskeletal: Bilateral calf pain times one week. Neurological: Negative for headache 10-point ROS otherwise  negative.  ____________________________________________   PHYSICAL EXAM:  VITAL SIGNS: ED Triage Vitals [06/09/16 1122]  Enc Vitals Group     BP 123/74     Pulse Rate 82     Resp 18     Temp 98.6 F (37 C)     Temp Source Oral     SpO2 98 %     Weight 152 lb (68.9 kg)     Height 5\' 5"  (1.651 m)     Head Circumference      Peak Flow      Pain Score 6     Pain Loc      Pain Edu?      Excl. in [place]?     Constitutional: Alert and oriented. Well appearing and in no distress. Eyes: Normal  exam ENT   Head: Normocephalic and atraumatic.   Mouth/Throat: Mucous membranes are moist. Cardiovascular: Normal rate, regular rhythm. No murmur Respiratory: Normal respiratory effort without tachypnea nor retractions. Breath sounds are clear and equal bilaterally. No wheezes/rales/rhonchi. Gastrointestinal: Soft, right lower abdominal incision with mild tenderness over incisional site, abdomen otherwise benign. No rebound or guarding. Musculoskeletal: Mild calf tenderness to palpation, neurovascularly intact, no edema noted. No erythema. Neurologic:  Normal speech and language. No gross focal neurologic deficits Skin:  Skin is warm, dry and intact.  Psychiatric: Patient admits to increased depression, friend recently passed away, denies SI or HI. Overall appears well, no notable flat affect  ____________________________________________   RADIOLOGY  [place] PENDING  ____________________________________________   INITIAL IMPRESSION / ASSESSMENT AND PLAN / ED COURSE  Pertinent labs & imaging results that were available during my care of the patient were reviewed by me and considered in my medical decision making (see chart for details).  The patient presents to the emergency department from Gen. surgery office for lower extremity discomfort in her cast for one week. We will obtain ultrasound to rule out DVT. She also states depression but strongly denies any SI or HI. Patient does not meet IVC criteria. I suggested she talked to psychiatrist, the patient is agreeable, but this will be voluntary.    Labs are largely within normal limits. Ultrasound pending. Psychiatry evaluation pending. Patient care signed out to Dr. Quentin Cornwall. ____________________________________________   FINAL CLINICAL IMPRESSION(S) / ED DIAGNOSES  Leg pain Depression    Harvest Dark, MD 06/09/16 1441

## 2016-06-09 NOTE — Discharge Instructions (Signed)
Please return to the ER should she have any significant worsening of pain that is not controlled with oral home medications. Return for fevers, redness or drainage around the incision site.

## 2016-06-09 NOTE — Patient Instructions (Signed)
We will take you to the Emergency Room at this time.

## 2016-06-09 NOTE — BH Assessment (Signed)
Assessment Note  Paula Spencer is an 60 y.o. female Who presents to the ER via her sister after being instructed to do so by her PCP. While patient was at her follow up doctor's appointment, she stated she had thoughts of ending her life. Thus, after the examination, her sister brought her to the ER.  Patient states the appointment was to check on the progress she was making after surgery. She had a portion of her "intestinal tract removed."   Per the report of the patient, "he took it (SI) out of proportion. I was saying the pain was so bad I wanted to die. I wanted to kill myself but I'm not going to do it. I love myself too much.." She further explains the pain is "on and off." Due to the pain, she had to make changes in her diet but reports they are "drastic changes." Her only complaint is the lack of sleep she is getting. Depends on the night, she have to sleep "on the recliner," other nights on a different chair and rarely sleeps in the bed. "If I move this way, it hurts and if I lay on this side too long, it may start hurting.."  She denies past mental health and substance abuse treatment. "I don't even drink and why will I see a doctor (psychiatrist)? I'm not crazy."  Patient identified her family as her primary support. She's worked in the [employer] for over 42 years. "I'm ready to retire but I'm still working."  When asked if it was okay for this Probation officer to talk with her sister, she stated, "Naw, It's okay. She know I'm not going to do nothing to hurt me, cause I love me too much to do anything like that."   Patient also mentioned the recent death of a co-worker. They passed away this past 01-25-2023 (06-22-16). "She fell dead. I knew she was sick. She took leave from work. But I didn't know she was that sick. We sure going to miss her."  Patient denies SI/HI and AV/H.   Past Medical History:  Past Medical History:  Diagnosis Date  . Anxiety   . Arthritis   . Back pain   .  Depression   . GERD (gastroesophageal reflux disease)   . Hypertension   . Polycystic disease, ovaries   . Vitamin D deficiency     Past Surgical History:  Procedure Laterality Date  . ABDOMINAL HYSTERECTOMY    . BACK SURGERY     cervical fusion  . carpel tunnel    . COLONOSCOPY WITH PROPOFOL N/A 04/02/2016   Procedure: COLONOSCOPY WITH PROPOFOL;  Surgeon: Lollie Sails, MD;  Location: [facility] ENDOSCOPY;  Service: Endoscopy;  Laterality: N/A;  . COLOSTOMY REVISION Right 05/06/2016   Procedure: COLON RESECTION RIGHT;  Surgeon: Florene Glen, MD;  Location: [facility] ORS;  Service: General;  Laterality: Right;  . PARTIAL HYSTERECTOMY    . TUBAL LIGATION      Family History:  Family History  Problem Relation Age of Onset  . Stroke Mother   . Kidney disease Mother   . Cancer Father   . Aneurysm Brother     Social History:  reports that she has been smoking Cigarettes.  She has a 7.50 pack-year smoking history. She has never used smokeless tobacco. She reports that she does not drink alcohol or use drugs.  Additional Social History:  Alcohol / Drug Use Pain Medications: See PTA Prescriptions: See PTA Over the Counter: See PTA  History of alcohol / drug use?: No history of alcohol / drug abuse Longest period of sobriety (when/how long): No past or current abuse Negative Consequences of Use:  (No past or current abuse) Withdrawal Symptoms:  (Reports of none)  CIWA: CIWA-Ar BP: 123/74 Pulse Rate: 82 COWS:    Allergies:  Allergies  Allergen Reactions  . Nsaids Other (See Comments)  . Skelaxin [Metaxalone] Other (See Comments)    Home Medications:  (Not in a hospital admission)  OB/GYN Status:  No LMP recorded. Patient has had a hysterectomy.  General Assessment Data Location of Assessment: [facility] ED TTS Assessment: In system Is this a Tele or Face-to-Face Assessment?: Face-to-Face Is this an Initial Assessment or a Re-assessment for this encounter?: Initial  Assessment Marital status: Single Maiden name: n/a Is patient pregnant?: No Pregnancy Status: No Living Arrangements: Alone Can pt return to current living arrangement?: Yes Admission Status: Voluntary Is patient capable of signing voluntary admission?: Yes Referral Source: Self/Family/Friend Insurance type: [place] Exam ([place]) Medical Exam completed: No Reason for MSE not completed: Other: (Patient pending Radiology Consult)  Crisis Care Plan Living Arrangements: Alone Legal Guardian: Other: Name of Psychiatrist: Reports of none Name of Therapist: Reports of none  Education Status Is patient currently in school?: No Current Grade: n/a Highest grade of school patient has completed: Associate Degree Name of school: n/a Contact person: n/a  Risk to self with the past 6 months Suicidal Ideation: No Has patient been a risk to self within the past 6 months prior to admission? : No Suicidal Intent: No Has patient had any suicidal intent within the past 6 months prior to admission? : No Is patient at risk for suicide?: No Suicidal Plan?: No Has patient had any suicidal plan within the past 6 months prior to admission? : No Access to Means: No What has been your use of drugs/alcohol within the last 12 months?: Reports of none Previous Attempts/Gestures: No How many times?: 0 Other Self Harm Risks: Reports of none Triggers for Past Attempts: None known Intentional Self Injurious Behavior: None Family Suicide History: No Recent stressful life event(s): Other (Comment) Persecutory voices/beliefs?: No Depression: Yes Depression Symptoms: Feeling worthless/self pity, Isolating, Tearfulness Substance abuse history and/or treatment for substance abuse?: No Suicide prevention information given to non-admitted patients: Not applicable  Risk to Others within the past 6 months Homicidal Ideation: No Does patient have any lifetime risk of violence toward  others beyond the six months prior to admission? : No Thoughts of Harm to Others: No Current Homicidal Intent: No Current Homicidal Plan: No Access to Homicidal Means: No Identified Victim: Reports of none History of harm to others?: No Assessment of Violence: None Noted Violent Behavior Description: Reports of none Does patient have access to weapons?: No Criminal Charges Pending?: No Does patient have a court date: No Is patient on probation?: No  Psychosis Hallucinations: None noted Delusions: None noted  Mental Status Report Appearance/Hygiene: In scrubs, Unremarkable, In hospital gown Eye Contact: Good Motor Activity: Unable to assess (Patient laying down) Speech: Logical/coherent, Unremarkable Level of Consciousness: Alert Mood: Depressed, Anxious Affect: Appropriate to circumstance Anxiety Level: Minimal Thought Processes: Coherent, Relevant Judgement: Unimpaired Orientation: Person, Place, Time, Situation, Appropriate for developmental age Obsessive Compulsive Thoughts/Behaviors: Minimal  Cognitive Functioning Concentration: Normal Memory: Recent Intact, Remote Intact IQ: Average Insight: Fair Impulse Control: Fair Appetite: Good Weight Loss: 0 Weight Gain: 0 Sleep: Decreased Total Hours of Sleep: 5 (Due to pains from the surgery) Vegetative  Symptoms: None  ADLScreening [facility] Assessment Services) Patient's cognitive ability adequate to safely complete daily activities?: Yes Patient able to express need for assistance with ADLs?: Yes Independently performs ADLs?: Yes (appropriate for developmental age)  Prior Inpatient Therapy Prior Inpatient Therapy: No Prior Therapy Dates: Reports of none Prior Therapy Facilty/Provider(s): Reports of none Reason for Treatment: Reports of none  Prior Outpatient Therapy Prior Outpatient Therapy: No Prior Therapy Dates: Reports of none Prior Therapy Facilty/Provider(s): Reports of none Reason for Treatment: Reports of  none Does patient have an ACCT team?: No Does patient have Intensive In-House Services?  : No Does patient have [facility] services? : No Does patient have P4CC services?: No  ADL Screening (condition at time of admission) Patient's cognitive ability adequate to safely complete daily activities?: Yes Is the patient deaf or have difficulty hearing?: No Does the patient have difficulty seeing, even when wearing glasses/contacts?: No Does the patient have difficulty concentrating, remembering, or making decisions?: No Patient able to express need for assistance with ADLs?: Yes Does the patient have difficulty dressing or bathing?: No Independently performs ADLs?: Yes (appropriate for developmental age) Does the patient have difficulty walking or climbing stairs?: No Weakness of Legs: None Weakness of Arms/Hands: None  Home Assistive Devices/Equipment Home Assistive Devices/Equipment: None  Therapy Consults (therapy consults require a physician order) PT Evaluation Needed: No OT Evalulation Needed: No SLP Evaluation Needed: No Abuse/Neglect Assessment (Assessment to be complete while patient is alone) Physical Abuse: Denies Verbal Abuse: Denies Sexual Abuse: Denies Exploitation of patient/patient's resources: Denies Self-Neglect: Denies Values / Beliefs Cultural Requests During Hospitalization: None Spiritual Requests During Hospitalization: None Consults Spiritual Care Consult Needed: No Social Work Consult Needed: No Regulatory affairs officer (For Healthcare) Does patient have an advance directive?: No Would patient like information on creating an advanced directive?: No - patient declined information    Additional Information 1:1 In Past 12 Months?: No CIRT Risk: No Elopement Risk: No Does patient have medical clearance?: No  Child/Adolescent Assessment Running Away Risk: Denies (Patient is an adult)  Disposition:  Disposition Initial Assessment Completed for this Encounter:  Yes Disposition of Patient: Other dispositions (ER MD Ordered Psych Consult)  On Site Evaluation by:   Reviewed with Physician:    Gunnar Fusi MS, LCAS, LPC, [place], CCSI Therapeutic Triage Specialist 06/09/2016 3:52 PM

## 2016-06-09 NOTE — Consult Note (Signed)
[facility] Consult   Reason for Consult:  Consult for 60 year old woman who was referred here from her surgeon's office because of concern about suicidal comments. Referring Physician:  Quentin Cornwall Patient Identification: Paula Spencer MRN:  [id number] Principal Diagnosis: Adjustment disorder with mixed disturbance of emotions and conduct Diagnosis:   Patient Active Problem List   Diagnosis Date Noted  . Adjustment disorder with mixed disturbance of emotions and conduct [F43.25] 06/09/2016  . Pain disorder [R52] 06/09/2016  . Colon polyp [K63.5] 05/06/2016  . History of colonic polyps [Z86.010]     Total Time spent with patient: 1 hour  Subjective:   Paula Spencer is a 60 y.o. female patient admitted with "I should've never been here".  HPI:  Patient interviewed. Chart reviewed. Labs and vitals reviewed. Old notes reviewed. This 60 year old woman had a colon resection last month and was going to her doctor's office today for a follow-up visit. Evidently while there she said that her pain in her abdomen was so bad that she might as well kill herself. Surgeon had her come to the emergency room for evaluation. Patient tells me that it is true that she made that comment but that she had no intention or plan of doing anything to kill herself. She says that she said it simply to emphasize how bad her pain was. She continues to be frustrated with the pain she is having this far out from her surgery. Her mood she says is generally okay except when her pain flares up. She has some trouble sleeping at night. Appetite has been fine. Energy level is generally been okay under the circumstances. Patient completely denies any suicidal thought intent or plan and denies any homicidal ideation. Denies any hallucinations or psychotic symptoms. She is not on any medication for any kind of psychiatric problem . Does not drink or use alcohol. She did have a friend who passed away suddenly about a  week ago and has some sadness around that but doesn't feel overwhelmed. She is able to articulate multiple positive things in her life and things she is looking forward to.  Social history: Lives by herself. Has family living close by. Indicates that she has a good relationship with her family and friends. Time at a [employer] and is very much looking forward to being able to retire and a little less than 2 years.  Medical history: High blood pressure. Just had colon surgery about a month ago and is still out of work from it.  Substance abuse history: Denies any history of alcohol or drug abuse    Past Psychiatric History: Patient denies ever seeing a psychiatrist or mental health provider for anything in her life. Never been in a psychiatric hospital. Never tried to harm herself. She says she was prescribed some medicine for anxiety a year or so ago but is off of those and is not having an anxiety problem.  Risk to Self: Suicidal Ideation: No Suicidal Intent: No Is patient at risk for suicide?: No Suicidal Plan?: No Access to Means: No What has been your use of drugs/alcohol within the last 12 months?: Reports of none How many times?: 0 Other Self Harm Risks: Reports of none Triggers for Past Attempts: None known Intentional Self Injurious Behavior: None Risk to Others: Homicidal Ideation: No Thoughts of Harm to Others: No Current Homicidal Intent: No Current Homicidal Plan: No Access to Homicidal Means: No Identified Victim: Reports of none History of harm to others?: No Assessment of  Violence: None Noted Violent Behavior Description: Reports of none Does patient have access to weapons?: No Criminal Charges Pending?: No Does patient have a court date: No Prior Inpatient Therapy: Prior Inpatient Therapy: No Prior Therapy Dates: Reports of none Prior Therapy Facilty/Provider(s): Reports of none Reason for Treatment: Reports of none Prior Outpatient Therapy: Prior Outpatient  Therapy: No Prior Therapy Dates: Reports of none Prior Therapy Facilty/Provider(s): Reports of none Reason for Treatment: Reports of none Does patient have an ACCT team?: No Does patient have Intensive In-House Services?  : No Does patient have [facility] services? : No Does patient have P4CC services?: No  Past Medical History:  Past Medical History:  Diagnosis Date  . Anxiety   . Arthritis   . Back pain   . Depression   . GERD (gastroesophageal reflux disease)   . Hypertension   . Polycystic disease, ovaries   . Vitamin D deficiency     Past Surgical History:  Procedure Laterality Date  . ABDOMINAL HYSTERECTOMY    . BACK SURGERY     cervical fusion  . carpel tunnel    . COLONOSCOPY WITH PROPOFOL N/A 04/02/2016   Procedure: COLONOSCOPY WITH PROPOFOL;  Surgeon: Lollie Sails, MD;  Location: [facility] ENDOSCOPY;  Service: Endoscopy;  Laterality: N/A;  . COLOSTOMY REVISION Right 05/06/2016   Procedure: COLON RESECTION RIGHT;  Surgeon: Florene Glen, MD;  Location: [facility] ORS;  Service: General;  Laterality: Right;  . PARTIAL HYSTERECTOMY    . TUBAL LIGATION     Family History:  Family History  Problem Relation Age of Onset  . Stroke Mother   . Kidney disease Mother   . Cancer Father   . Aneurysm Brother    Family Psychiatric  History: Patient denies being aware of any kind of family history of mental health problems Social History:  History  Alcohol Use No     History  Drug Use No    Social History   Social History  . Marital status: Single    Spouse name: N/A  . Number of children: N/A  . Years of education: N/A   Social History Main Topics  . Smoking status: Current Every Day Smoker    Packs/day: 0.25    Years: 30.00    Types: Cigarettes  . Smokeless tobacco: Never Used  . Alcohol use No  . Drug use: No  . Sexual activity: Not Asked   Other Topics Concern  . None   Social History Narrative  . None   Additional Social History:    Allergies:    Allergies  Allergen Reactions  . Nsaids Other (See Comments)  . Skelaxin [Metaxalone] Other (See Comments)    Labs:  Results for orders placed or performed during the hospital encounter of 06/09/16 (from the past 48 hour(s))  Lipase, blood     Status: None   Collection Time: 06/09/16 11:25 AM  Result Value Ref Range   Lipase 31 11 - 51 U/L  Comprehensive metabolic panel     Status: Abnormal   Collection Time: 06/09/16 11:25 AM  Result Value Ref Range   Sodium 140 135 - 145 mmol/L   Potassium 4.0 3.5 - 5.1 mmol/L   Chloride 106 101 - 111 mmol/L   CO2 29 22 - 32 mmol/L   Glucose, Bld 101 (H) 65 - 99 mg/dL   BUN 9 6 - 20 mg/dL   Creatinine, Ser 0.95 0.44 - 1.00 mg/dL   Calcium 9.5 8.9 - 10.3 mg/dL  Total Protein 7.7 6.5 - 8.1 g/dL   Albumin 4.4 3.5 - 5.0 g/dL   AST 16 15 - 41 U/L   ALT 14 14 - 54 U/L   Alkaline Phosphatase 49 38 - 126 U/L   Total Bilirubin 0.5 0.3 - 1.2 mg/dL   GFR calc non Af Amer >60 >60 mL/min   GFR calc Af Amer >60 >60 mL/min    Comment: (NOTE) The eGFR has been calculated using the CKD EPI equation. This calculation has not been validated in all clinical situations. eGFR's persistently <60 mL/min signify possible Chronic Kidney Disease.    Anion gap 5 5 - 15  CBC     Status: None   Collection Time: 06/09/16 11:25 AM  Result Value Ref Range   WBC 6.6 3.6 - 11.0 K/uL   RBC 4.88 3.80 - 5.20 MIL/uL   Hemoglobin 14.5 12.0 - 16.0 g/dL   HCT 43.2 35.0 - 47.0 %   MCV 88.5 80.0 - 100.0 fL   MCH 29.8 26.0 - 34.0 pg   MCHC 33.7 32.0 - 36.0 g/dL   RDW 14.3 11.5 - 14.5 %   Platelets 250 150 - 440 K/uL  Urinalysis complete, with microscopic     Status: Abnormal   Collection Time: 06/09/16 11:25 AM  Result Value Ref Range   Color, Urine STRAW (A) YELLOW   APPearance CLEAR (A) CLEAR   Glucose, UA NEGATIVE NEGATIVE mg/dL   Bilirubin Urine NEGATIVE NEGATIVE   Ketones, ur NEGATIVE NEGATIVE mg/dL   Specific Gravity, Urine 1.009 1.005 - 1.030   Hgb urine  dipstick NEGATIVE NEGATIVE   pH 8.0 5.0 - 8.0   Protein, ur NEGATIVE NEGATIVE mg/dL   Nitrite NEGATIVE NEGATIVE   Leukocytes, UA NEGATIVE NEGATIVE   RBC / HPF NONE SEEN 0 - 5 RBC/hpf   WBC, UA 0-5 0 - 5 WBC/hpf   Bacteria, UA NONE SEEN NONE SEEN   Squamous Epithelial / LPF 0-5 (A) NONE SEEN    No current facility-administered medications for this encounter.    Current Outpatient Prescriptions  Medication Sig Dispense Refill  . amLODipine (NORVASC) 10 MG tablet Take 1 tablet by mouth daily.  4  . Cholecalciferol (VITAMIN D PO) Take 1 tablet by mouth daily.    . DOCOSAHEXAENOIC ACID PO Take 1 tablet by mouth 1 day or 1 dose.    Marland Kitchen LORazepam (ATIVAN) 1 MG tablet Take 1 mg by mouth 3 (three) times daily as needed for anxiety.      Musculoskeletal: Strength & Muscle Tone: within normal limits Gait & Station: normal Patient leans: N/A  Psychiatric Specialty Exam: Physical Exam  Nursing note and vitals reviewed. Constitutional: She appears well-developed and well-nourished.  HENT:  Head: Normocephalic and atraumatic.  Eyes: Conjunctivae are normal. Pupils are equal, round, and reactive to light.  Neck: Normal range of motion.  Cardiovascular: Regular rhythm and normal heart sounds.   Respiratory: She is in respiratory distress.  GI: Soft. There is tenderness.  Musculoskeletal: Normal range of motion.  Neurological: She is alert.  Skin: Skin is warm and dry.  Psychiatric: Her speech is normal and behavior is normal. Judgment and thought content normal. Her affect is blunt. Thought content is not paranoid. Cognition and memory are normal. She expresses no homicidal and no suicidal ideation.    Review of Systems  Constitutional: Negative.   HENT: Negative.   Eyes: Negative.   Respiratory: Negative.   Cardiovascular: Negative.   Gastrointestinal: Positive for abdominal pain.  Musculoskeletal: Negative.   Skin: Negative.   Neurological: Negative.   Psychiatric/Behavioral:  Negative for depression, hallucinations, memory loss, substance abuse and suicidal ideas. The patient has insomnia. The patient is not nervous/anxious.     Blood pressure 123/74, pulse 82, temperature 98.6 F (37 C), temperature source Oral, resp. rate 18, height _0  (1.651 m), weight 68.9 kg (152 lb), SpO2 98 %.Body mass index is 25.29 kg/m.  General Appearance: Fairly Groomed  Eye Contact:  Good  Speech:  Clear and Coherent  Volume:  Normal  Mood:  Euthymic  Affect:  Blunt  Thought Process:  Goal Directed  Orientation:  Full (Time, Place, and Person)  Thought Content:  Logical  Suicidal Thoughts:  No  Homicidal Thoughts:  No  Memory:  Immediate;   Good Recent;   Fair Remote;   Fair  Judgement:  Fair  Insight:  Fair  Psychomotor Activity:  Decreased  Concentration:  Concentration: Good  Recall:  Good  Fund of Knowledge:  Good  Language:  Good  Akathisia:  No  Handed:  Right  AIMS (if indicated):     Assets:  Communication Skills Desire for Improvement Financial Resources/Insurance Housing Physical Health Resilience Social Support  ADL's:  Intact  Cognition:  WNL  Sleep:        Treatment Plan Summary: Plan This is a 60 year old woman with no significant past psychiatric history who was referred to the emergency room because she made a comment about being in so much pain that she might as well kill her self. Patient has been consistent here in the ER that she had no serious thought about doing any such thing. She does not appear to me to have any symptoms of depression. She does not appear to have many significant risk factors for suicide. Certainly does not meet commitment criteria. I'll think she requires any specific psychiatric follow-up. Supportive counseling completed and gently reminded her that those sorts of comments will usually be taken seriously and that's why she ended up having to spend some time in the emergency room. Patient can be released from the  emergency room at the discretion of the ER physician and no further treatment or medication required.  Disposition: Patient does not meet criteria for psychiatric inpatient admission. Supportive therapy provided about ongoing stressors.  Alethia Berthold, MD 06/09/2016 4:43 PM

## 2016-06-09 NOTE — ED Notes (Signed)
Pt reports she had surgery August 10th ("rerouted intestines") - Dr Adonis Huguenin is pt doctor and she told him that she was sore/depressed/no energy - Dr Adonis Huguenin told pt to come to the er and be evaluated - pt thinks she may be anemic - pt c/o bilat lower leg pain (no redness/no swelling/no warmth to area)

## 2016-06-10 ENCOUNTER — Telehealth: Payer: Self-pay

## 2016-06-10 NOTE — Telephone Encounter (Signed)
Patient called asking if she was going to get any pain medications. I told her that Dr. Adonis Huguenin did not prescribe her any pain medications. Patient did not ask for a refill and stated that it was fine. I told her that if she needed anything to call us back. Patient understood and had no further questions.

## 2016-06-11 ENCOUNTER — Telehealth: Payer: Self-pay | Admitting: Surgery

## 2016-06-11 ENCOUNTER — Telehealth: Payer: Self-pay

## 2016-06-11 NOTE — Telephone Encounter (Signed)
Please see other note from today.

## 2016-06-11 NOTE — Telephone Encounter (Signed)
Patient has called and would like a note to release her back to work. Please contact patient once this has been completed. She will return to work on 06/14/16. Phone number verified in chart.

## 2016-06-11 NOTE — Telephone Encounter (Signed)
Spoke with patient at this time. She does not feel that her pain was taken care of on her last visit and does not feel that she can go back to work in a [place] next week. She is still having pain at her umbilical site and would like this addressed by another surgeon.   Placed patient on schedule with Dr. Azalee Course and explained to patient that we could extend her disability to 06/21/16 but we will discuss her pain and disability more in depth at her appointment. Patient is agreeable to this plan.

## 2016-06-11 NOTE — Telephone Encounter (Signed)
Patient called with questions on when she is able to go back to work and if so, they will only take her back with no restrictions. I let the patient know that Amber is going to call her when she gets a chance to review that over with her.

## 2016-06-14 ENCOUNTER — Telehealth: Payer: Self-pay

## 2016-06-14 ENCOUNTER — Encounter: Payer: Self-pay | Admitting: Surgery

## 2016-06-14 ENCOUNTER — Ambulatory Visit (INDEPENDENT_AMBULATORY_CARE_PROVIDER_SITE_OTHER): Payer: BLUE CROSS/BLUE SHIELD | Admitting: Surgery

## 2016-06-14 VITALS — BP 157/86 | HR 80 | Temp 98.4°F | Ht 66.0 in | Wt 152.0 lb

## 2016-06-14 DIAGNOSIS — K635 Polyp of colon: Secondary | ICD-10-CM

## 2016-06-14 MED ORDER — CYCLOBENZAPRINE HCL 5 MG PO TABS: 5.0000 mg | ORAL_TABLET | Freq: Three times a day (TID) | ORAL | 0 refills | Status: DC | PRN

## 2016-06-14 MED ORDER — GABAPENTIN 100 MG PO CAPS: 100.0000 mg | ORAL_CAPSULE | Freq: Three times a day (TID) | ORAL | 0 refills | Status: DC

## 2016-06-14 NOTE — Telephone Encounter (Signed)
Call made to pharmacy at this time. Medication was not received. Medication sent to Pharmacy once again at this time.  Called pharmacy back and was routed to an automated box where I left a message for the pharmacist to call me if he/she had any questions.  Made return phone call to patient and explained above information. She thanked me for this.

## 2016-06-14 NOTE — Progress Notes (Signed)
60 year old female who had a right open colon resection done August 10 with Dr. Burt Knack. Patient is still having pain in the umbilical area near the incision site. The patient states that anytime her clothes rub in the area even the sheets that this area hurts significantly. Patient states that otherwise it does feel like a stabbing pain. She has been eating well and her appetite has improved. The patient is having good bowel movements about 1 every day to every other day. She sometimes does have to take MiraLAX to help. 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  Patient was having some issues last clinic with depression and was sent to the ED from clinic due to this. The patient states that she's much better from this and is laughing and joking with me during the visit as appropriate.  Vitals:   06/14/16 1026  BP: (!) 157/86  Pulse: 80  Temp: 98.4 F (36.9 C)   PE:  Gen; NAD Abd: soft, transverse incision from inferior to umbilicus to RLQ, no hernias, no erythema, no drainage, tenderness in umbilical region Ext: no tenderness in calves, no edema  A/P:  60 year old who had an open colon resection 5 weeks ago. The patient is having some hypersensitive pain the nerve like pain near the umbilical part of her incision. I will start her on Neurontin 100 mg twice daily. She also endorses some muscle spasm type pain especially with lifting things or raising her arms overhead and will give her some Flexeril 5 mg 3 times a day when necessary muscle spasms. I also discussed with her using ice packs especially toward the end of the day which she's been up and moving around. Due to the fact that the patient lifts 100-200 pound pallets at work and is still having this pain would like to keep her out of work for a full 8 weeks. We gave her a letter today that states her return to work date is October 16. I will see her in follow-up in clinic in  3 weeks to ensure that she is improving and that the pain is better with these medications. She is also instructed to cough there any questions or concerns.

## 2016-06-14 NOTE — Telephone Encounter (Signed)
Wykisha went to the pharmacy and they gave her the Cyanocobalmin but they gave her the 5 mg and not the 500 MCG. The pharmacist said that she did not receive the Cyclobenzaprine. J[place] wants you to call her to see what's going on.

## 2016-06-14 NOTE — Telephone Encounter (Signed)
Disability form was filled out and faxed to [facility] [phone]. Patient was notified.

## 2016-06-14 NOTE — Patient Instructions (Signed)
We have sent your medicines to the drug store. Please see your follow up appointment below. Please call our office if you have questions or concerns.

## 2016-06-15 ENCOUNTER — Ambulatory Visit: Payer: BLUE CROSS/BLUE SHIELD

## 2016-06-28 ENCOUNTER — Other Ambulatory Visit: Payer: Self-pay | Admitting: Surgery

## 2016-06-28 MED ORDER — GABAPENTIN 100 MG PO CAPS: 100.0000 mg | ORAL_CAPSULE | Freq: Three times a day (TID) | ORAL | 0 refills | Status: DC

## 2016-06-28 MED ORDER — CYCLOBENZAPRINE HCL 5 MG PO TABS: 5.0000 mg | ORAL_TABLET | Freq: Three times a day (TID) | ORAL | 0 refills | Status: DC | PRN

## 2016-06-28 NOTE — Telephone Encounter (Signed)
Medication sent to pharmacy at this time. 

## 2016-06-28 NOTE — Telephone Encounter (Signed)
Patient had colon resection on 8/10117 with Dr Burt Knack.  She would like a refill of medication - Gabapentin and Cyclobenzaprine. Please call and advise.   [facility] is patients pharmacy.   Thank you.

## 2016-07-08 ENCOUNTER — Ambulatory Visit (INDEPENDENT_AMBULATORY_CARE_PROVIDER_SITE_OTHER): Payer: BLUE CROSS/BLUE SHIELD | Admitting: Surgery

## 2016-07-08 ENCOUNTER — Encounter: Payer: Self-pay | Admitting: Surgery

## 2016-07-08 VITALS — BP 137/80 | HR 103 | Temp 98.3°F | Wt 158.0 lb

## 2016-07-08 DIAGNOSIS — D122 Benign neoplasm of ascending colon: Secondary | ICD-10-CM

## 2016-07-08 MED ORDER — CYCLOBENZAPRINE HCL 5 MG PO TABS: 5.0000 mg | ORAL_TABLET | Freq: Three times a day (TID) | ORAL | 0 refills | Status: DC | PRN

## 2016-07-08 MED ORDER — GABAPENTIN 100 MG PO CAPS: 100.0000 mg | ORAL_CAPSULE | Freq: Three times a day (TID) | ORAL | 0 refills | Status: DC

## 2016-07-08 NOTE — Patient Instructions (Signed)
Please give us a call if you have any questions or concerns. 

## 2016-07-08 NOTE — Progress Notes (Signed)
60 year old female who had a right colon resection on 8/10 with Dr. Burt Knack. The patient did have some significant pain and nerve pain around the umbilicus afterwards and she was given Neurontin a few weeks ago to see if this helps with the pain. 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 She is now the 6 week mark. Patient states she's been feeling pretty good that the pain in this area has significantly improved she's had no nausea or vomiting her appetite is better and she is having good bowel movements.  Vitals:   07/08/16 0904  BP: 137/80  Pulse: (!) 103  Temp: 98.3 F (36.8 C)   PE:  Gen: NAD Abd: soft, transverse incision healing well, minimal tenderness around umbilicus   A/P:  Patient is significantly improved she still sometimes needs the Flexeril and Neurontin so they for refill for those. She also take Tylenol and ibuprofen for the pain if needed. We'll give her a return to work note as well so that she can go back to her job. To cost with any questions or concerns.

## 2016-07-13 ENCOUNTER — Telehealth: Payer: Self-pay | Admitting: Surgery

## 2016-07-13 NOTE — Telephone Encounter (Signed)
Patient called this morning and stated she was having pain under her navel where they made the incision. No redness no fever no bleeding. It's bad enough she said she might have to leave work and go home. She said if she didn't answer please leave a voice mail.

## 2016-07-13 NOTE — Telephone Encounter (Signed)
Patient called again and stated that she left work because she could not stand the pain. I asked her what were her symptoms. She stated that she was having pain on her naval. She also stated that she vomited yesterday but not today. I asked if her if her naval looked red and inflammed, if she had any fever or chills. Or if she had any drainage from her umbilical. She answered no to all of my questions.  I told her that I would have to ask Dr. Azalee Course if she could see her or if she allowed me to write a letter for work. Patient understood that she would have to wait for a response from me. I told her that Dr. Azalee Course was working night shifts so it would probably be until tomorrow that I would hear from Dr. Azalee Course. Patient understood.

## 2016-07-14 NOTE — Telephone Encounter (Signed)
Pt called back wondering if Dr. Azalee Course wrote anything. Pt stated that she is off work today, and that if she doesn't hear anything back by today, she'll keep taking what she currently has. Pt said she just doesn't want to mess anything up and if there is something stronger for her to take for the pain she would like to have it. Pt also stated that she was really looking for answers. Please advise.

## 2016-07-14 NOTE — Telephone Encounter (Signed)
Called patient back to let her know that Dr. Azalee Course has not called or replied back to me. I told patient that as soon as Dr. Azalee Course respond that I would let her know. Patient also told me that she was having pain on her naval but with no other symptoms. I recommended for her to take Ibuprofen but she stated that she had liver problems therefore she was not able to take it. I then asked if she was still taking Gabapentin and Flexeril. She stated that she was but was not helping her with the pain. I told her to try taking Acetaminophen and she stated that it wouldn't help with her pain. I told her that I would ask Dr. Azalee Course if there is anything else that she could take for her pain.

## 2016-07-15 MED ORDER — GABAPENTIN 300 MG PO CAPS
300.0000 mg | ORAL_CAPSULE | Freq: Three times a day (TID) | ORAL | 0 refills | Status: DC
Start: 2016-07-15 — End: 2021-10-26

## 2016-07-15 NOTE — Telephone Encounter (Signed)
Called patient back and had to leave a voicemail to return my call. Awaiting on patient to call me back.  This is what Dr. Azalee Course recommends: She is 6 weeks out from her surgery. We could write her a work excuse with restrictions for a couple of weeks. She does lift heavy palates at work, so we could do no lifting over 15lbs for 2 weeks. Thanks.

## 2016-07-15 NOTE — Telephone Encounter (Signed)
Patient called me back and I told her what Dr. Azalee Course recommended. She stated that she would give Gabapentin 300 MG a chance. She also stated that today she went back to work and is doing well. She also stated that she does not need a note for work. I told patient to give [place] a call if she has any questions or concerns. Patient understood.

## 2016-07-20 ENCOUNTER — Ambulatory Visit
Admission: RE | Admit: 2016-07-20 | Discharge: 2016-07-20 | Disposition: A | Discharge status: Home / Self Care | Payer: BLUE CROSS/BLUE SHIELD | Source: Ambulatory Visit | Attending: Family Medicine | Admitting: Family Medicine

## 2016-07-20 DIAGNOSIS — Z1231 Encounter for screening mammogram for malignant neoplasm of breast: Secondary | ICD-10-CM | POA: Diagnosis present

## 2016-07-26 NOTE — Discharge Summary (Signed)
Patient ID: Paula Spencer MRN: [id number] DOB/AGE: 60/07/1956 60 y.o.  Admit date: 05/06/2016 Discharge date: 07/26/2016  Discharge Diagnoses:  Cecal polyp  Procedures Performed: Right colon resection with ileocolic anastomosis  Discharged Condition: good  Hospital Course: She was admitted for resection of a cecal polyp. After appropriate preoperative preparation informed consent she was taken to surgery where a right colon resection and ileocolic anastomosis performed. She had a slow return of bowel function but was able tolerate a regular diet prior to discharge. Wounds look good with no sign of any infection. She was discharged home with plan to follow-up in the office in 7-10 days time. Bathing activity drive instructions were given the patient.  Discharge Orders: Discharge Instructions    Diet - low sodium heart healthy    Complete by:  As directed    Driving Restrictions    Complete by:  As directed    Do not drive while taking pain medication   Increase activity slowly    Complete by:  As directed    Lifting restrictions    Complete by:  As directed    Do not lift anything heavier than a dinner plate   No wound care    Complete by:  As directed    Other Restrictions    Complete by:  As directed    You may bathe      Disposition: 01-Home or Self Care  Discharge Medications: No current facility-administered medications for this encounter.   Current Outpatient Prescriptions:  .  amLODipine (NORVASC) 10 MG tablet, Take 1 tablet by mouth daily., Disp: , Rfl: 4 .  Cholecalciferol (VITAMIN D PO), Take 1 tablet by mouth daily., Disp: , Rfl:  .  LORazepam (ATIVAN) 1 MG tablet, Take 1 mg by mouth 3 (three) times daily as needed for anxiety., Disp: , Rfl:  .  cyanocobalamin 500 MCG tablet, Take 500 mcg by mouth daily., Disp: , Rfl:  .  cyclobenzaprine (FLEXERIL) 5 MG tablet, Take 1 tablet (5 mg total) by mouth 3 (three) times daily as needed for muscle spasms., Disp: 30  tablet, Rfl: 0 .  DOCOSAHEXAENOIC ACID PO, Take 1 tablet by mouth 1 day or 1 dose., Disp: , Rfl:  .  gabapentin (NEURONTIN) 300 MG capsule, Take 1 capsule (300 mg total) by mouth 3 (three) times daily., Disp: 60 capsule, Rfl: 0 .  polyethylene glycol (MIRALAX / GLYCOLAX) packet, Take 17 g by mouth daily., Disp: , Rfl:   Follwup:   Signed: Dia Crawford III 07/26/2016, 11:38 AM

## 2016-09-22 DIAGNOSIS — Z9049 Acquired absence of other specified parts of digestive tract: Secondary | ICD-10-CM | POA: Insufficient documentation

## 2017-02-20 ENCOUNTER — Other Ambulatory Visit
Admit: 2017-02-20 | Discharge: 2017-02-20 | Disposition: A | Discharge status: Home / Self Care | Payer: BLUE CROSS/BLUE SHIELD | Source: Ambulatory Visit | Attending: Family Medicine | Admitting: Family Medicine

## 2017-02-20 ENCOUNTER — Ambulatory Visit
Admission: RE | Admit: 2017-02-20 | Discharge: 2017-02-20 | Disposition: A | Discharge status: Home / Self Care | Payer: BLUE CROSS/BLUE SHIELD | Source: Ambulatory Visit | Attending: Family Medicine | Admitting: Family Medicine

## 2017-02-20 ENCOUNTER — Other Ambulatory Visit: Payer: Self-pay | Admitting: Family Medicine

## 2017-02-20 DIAGNOSIS — G8929 Other chronic pain: Secondary | ICD-10-CM

## 2017-02-20 DIAGNOSIS — M25562 Pain in left knee: Secondary | ICD-10-CM | POA: Diagnosis not present

## 2017-09-12 ENCOUNTER — Other Ambulatory Visit: Payer: Self-pay | Admitting: Family Medicine

## 2017-09-12 DIAGNOSIS — Z1231 Encounter for screening mammogram for malignant neoplasm of breast: Secondary | ICD-10-CM

## 2017-10-07 ENCOUNTER — Ambulatory Visit
Admission: RE | Admit: 2017-10-07 | Discharge: 2017-10-07 | Disposition: A | Discharge status: Home / Self Care | Payer: BLUE CROSS/BLUE SHIELD | Source: Ambulatory Visit | Attending: Family Medicine | Admitting: Family Medicine

## 2017-10-07 DIAGNOSIS — Z1231 Encounter for screening mammogram for malignant neoplasm of breast: Secondary | ICD-10-CM | POA: Diagnosis not present

## 2018-06-27 ENCOUNTER — Other Ambulatory Visit: Payer: Self-pay | Admitting: Family Medicine

## 2018-06-27 DIAGNOSIS — Z1231 Encounter for screening mammogram for malignant neoplasm of breast: Secondary | ICD-10-CM

## 2018-11-27 ENCOUNTER — Ambulatory Visit: Payer: Self-pay | Attending: Oncology | Admitting: *Deleted

## 2018-11-27 ENCOUNTER — Encounter: Payer: Self-pay | Admitting: *Deleted

## 2018-11-27 ENCOUNTER — Ambulatory Visit
Admission: RE | Admit: 2018-11-27 | Discharge: 2018-11-27 | Disposition: A | Discharge status: Home / Self Care | Payer: Self-pay | Source: Ambulatory Visit | Attending: Oncology | Admitting: Oncology

## 2018-11-27 VITALS — BP 143/77 | HR 85 | Temp 99.1°F | Ht 66.0 in | Wt 159.0 lb

## 2018-11-27 DIAGNOSIS — Z0000 Encounter for general adult medical examination without abnormal findings: Secondary | ICD-10-CM | POA: Insufficient documentation

## 2018-11-27 NOTE — Patient Instructions (Signed)
Gave patient hand-out, Women Staying Healthy, Active and Well from [facility], with education on breast health, pap smears, heart and colon health. 

## 2018-11-27 NOTE — Progress Notes (Signed)
  Subjective:     Patient ID: Paula Spencer, female   DOB: [birth year], 63 y.o.   MRN: [id number]  HPI   Review of Systems     Objective:   Physical Exam Chest:     Breasts:        Right: No swelling, bleeding, inverted nipple, mass, nipple discharge, skin change or tenderness.        Left: No swelling, bleeding, inverted nipple, mass, nipple discharge, skin change or tenderness.    Lymphadenopathy:     Upper Body:     Right upper body: No supraclavicular or axillary adenopathy.     Left upper body: No supraclavicular or axillary adenopathy.        Assessment:     63 year old Black female referral from [facility] presents to [facility] for clinical breast exam and mammogram only.  Clinical breast exam unremarkable.  Taught self breast awareness.  Last pap on 07/25/18 was negative / negative.  Next pap due in 5 years.  Patient has been screened for eligibility.  She does not have any insurance, Medicare or Medicaid.  She also meets financial eligibility.  Hand-out given on the Affordable Care Act.  Risk Assessment    Risk Scores      11/27/2018   Last edited by: Orson Slick, CMA   5-year risk: 1.6 %   Lifetime risk: 6.3 %            Plan:     Screening mammogram ordered.  Will follow-up per [facility] protocol.

## 2018-11-28 ENCOUNTER — Encounter: Payer: Self-pay | Admitting: *Deleted

## 2018-11-28 NOTE — Progress Notes (Signed)
Letter mailed from the [facility] to inform patient of her normal mammogram results.  Patient is to follow-up with annual screening in one year.  HSIS to Christy. 

## 2018-12-18 DIAGNOSIS — M545 Low back pain, unspecified: Secondary | ICD-10-CM | POA: Insufficient documentation

## 2020-01-10 DIAGNOSIS — J387 Other diseases of larynx: Secondary | ICD-10-CM | POA: Insufficient documentation

## 2020-02-05 ENCOUNTER — Ambulatory Visit: Payer: Self-pay

## 2020-02-06 ENCOUNTER — Ambulatory Visit
Admission: RE | Admit: 2020-02-06 | Discharge: 2020-02-06 | Disposition: A | Discharge status: Home / Self Care | Payer: Self-pay | Source: Ambulatory Visit | Attending: Oncology | Admitting: Oncology

## 2020-02-06 ENCOUNTER — Ambulatory Visit: Payer: Self-pay | Attending: Oncology | Admitting: *Deleted

## 2020-02-06 ENCOUNTER — Encounter: Payer: Self-pay | Admitting: *Deleted

## 2020-02-06 ENCOUNTER — Other Ambulatory Visit: Payer: Self-pay

## 2020-02-06 VITALS — BP 121/79 | HR 75 | Temp 96.8°F | Ht 68.0 in | Wt 160.0 lb

## 2020-02-06 DIAGNOSIS — Z0000 Encounter for general adult medical examination without abnormal findings: Secondary | ICD-10-CM

## 2020-02-06 NOTE — Progress Notes (Signed)
  Subjective:     Patient ID: Paula Spencer, female   DOB: [birth year], 64 y.o.   MRN: [id number]  HPI   Review of Systems     Objective:   Physical Exam Chest:     Breasts:        Right: No swelling, bleeding, inverted nipple, mass, nipple discharge, skin change or tenderness.        Left: No swelling, bleeding, inverted nipple, mass, nipple discharge, skin change or tenderness.    Lymphadenopathy:     Upper Body:     Right upper body: No supraclavicular or axillary adenopathy.     Left upper body: No supraclavicular or axillary adenopathy.        Assessment:     64 year old Black female returns to [facility] for annual screening.  Clinical breast exam unremarkable.  Taught self breast awareness.  Patient with a history of partial hysterectomy.  Pap omitted per [facility] protocol.  Patient has been screened for eligibility.  She does not have any insurance, Medicare or Medicaid.  She also meets financial eligibility.   Risk Assessment    Risk Scores      02/06/2020 11/27/2018   Last edited by: Theodore Demark, RN [place], Laddie Aquas, CMA   5-year risk: 1.6 % 1.6 %   Lifetime risk: 6.2 % 6.3 %            Plan:     Screening mammogram ordered.  Will follow up per [facility] protocol.

## 2020-02-06 NOTE — Patient Instructions (Signed)
Gave patient hand-out, Women Staying Healthy, Active and Well from [facility], with education on breast health, pap smears, heart and colon health. 

## 2020-02-07 ENCOUNTER — Encounter: Payer: Self-pay | Admitting: *Deleted

## 2020-02-07 NOTE — Progress Notes (Signed)
Letter mailed from the [facility] to inform patient of her normal mammogram results.  Patient is to follow-up with annual screening in one year. 

## 2020-10-21 ENCOUNTER — Other Ambulatory Visit: Payer: Self-pay | Admitting: Family Medicine

## 2020-10-21 DIAGNOSIS — Z1231 Encounter for screening mammogram for malignant neoplasm of breast: Secondary | ICD-10-CM

## 2021-03-04 ENCOUNTER — Ambulatory Visit
Admission: RE | Admit: 2021-03-04 | Discharge: 2021-03-04 | Disposition: A | Discharge status: Home / Self Care | Payer: Medicare HMO | Source: Ambulatory Visit | Attending: Family Medicine | Admitting: Family Medicine

## 2021-03-04 ENCOUNTER — Other Ambulatory Visit: Payer: Self-pay

## 2021-03-04 DIAGNOSIS — Z1231 Encounter for screening mammogram for malignant neoplasm of breast: Secondary | ICD-10-CM | POA: Diagnosis not present

## 2021-04-27 DIAGNOSIS — Z96642 Presence of left artificial hip joint: Secondary | ICD-10-CM | POA: Insufficient documentation

## 2021-05-03 DIAGNOSIS — Z01818 Encounter for other preprocedural examination: Secondary | ICD-10-CM | POA: Insufficient documentation

## 2021-05-14 HISTORY — PX: TOTAL HIP ARTHROPLASTY: SHX124

## 2021-05-26 DIAGNOSIS — Z96649 Presence of unspecified artificial hip joint: Secondary | ICD-10-CM | POA: Insufficient documentation

## 2021-07-21 ENCOUNTER — Other Ambulatory Visit: Payer: Self-pay | Admitting: Family Medicine

## 2021-07-21 DIAGNOSIS — Z13820 Encounter for screening for osteoporosis: Secondary | ICD-10-CM

## 2021-07-29 ENCOUNTER — Inpatient Hospital Stay: Admission: RE | Admit: 2021-07-29 | Payer: Medicare HMO | Source: Ambulatory Visit

## 2021-08-19 ENCOUNTER — Ambulatory Visit
Admission: RE | Admit: 2021-08-19 | Discharge: 2021-08-19 | Disposition: A | Discharge status: Home / Self Care | Payer: Medicare HMO | Source: Ambulatory Visit | Attending: Family Medicine | Admitting: Family Medicine

## 2021-08-19 ENCOUNTER — Other Ambulatory Visit: Payer: Self-pay

## 2021-08-19 DIAGNOSIS — Z780 Asymptomatic menopausal state: Secondary | ICD-10-CM | POA: Insufficient documentation

## 2021-08-19 DIAGNOSIS — M85832 Other specified disorders of bone density and structure, left forearm: Secondary | ICD-10-CM | POA: Insufficient documentation

## 2021-08-19 DIAGNOSIS — Z13820 Encounter for screening for osteoporosis: Secondary | ICD-10-CM | POA: Diagnosis not present

## 2021-10-26 ENCOUNTER — Ambulatory Visit (INDEPENDENT_AMBULATORY_CARE_PROVIDER_SITE_OTHER): Payer: Medicare HMO | Admitting: Gastroenterology

## 2021-10-26 ENCOUNTER — Other Ambulatory Visit: Payer: Self-pay

## 2021-10-26 ENCOUNTER — Encounter: Payer: Self-pay | Admitting: Gastroenterology

## 2021-10-26 VITALS — BP 131/80 | HR 93 | Temp 99.0°F | Ht 66.0 in | Wt 156.0 lb

## 2021-10-26 DIAGNOSIS — K219 Gastro-esophageal reflux disease without esophagitis: Secondary | ICD-10-CM

## 2021-10-26 DIAGNOSIS — N183 Chronic kidney disease, stage 3 unspecified: Secondary | ICD-10-CM | POA: Insufficient documentation

## 2021-10-26 DIAGNOSIS — I10 Essential (primary) hypertension: Secondary | ICD-10-CM | POA: Insufficient documentation

## 2021-10-26 DIAGNOSIS — F419 Anxiety disorder, unspecified: Secondary | ICD-10-CM | POA: Insufficient documentation

## 2021-10-26 DIAGNOSIS — S83289A Other tear of lateral meniscus, current injury, unspecified knee, initial encounter: Secondary | ICD-10-CM | POA: Insufficient documentation

## 2021-10-26 DIAGNOSIS — M1612 Unilateral primary osteoarthritis, left hip: Secondary | ICD-10-CM | POA: Insufficient documentation

## 2021-10-26 DIAGNOSIS — D126 Benign neoplasm of colon, unspecified: Secondary | ICD-10-CM

## 2021-10-26 MED ORDER — NA SULFATE-K SULFATE-MG SULF 17.5-3.13-1.6 GM/177ML PO SOLN
354.0000 mL | Freq: Once | ORAL | 0 refills | Status: AC
Start: 2021-10-26 — End: 2021-10-26

## 2021-10-26 NOTE — Progress Notes (Signed)
Cephas Darby, MD [address]  [place]  [place], [place] [address]  Main: [phone]  Fax: [phone]    Gastroenterology Consultation  Referring Provider:     Marguerita Merles, MD Primary Care Physician:  Marguerita Merles, MD Primary Gastroenterologist:  Dr. Cephas Darby Reason for Consultation:   Blood in stool        HPI:   Paula Spencer is a 66 y.o. female referred by Dr. Lennox Grumbles, Connye Burkitt, MD  for consultation & management of blood in stool.  Patient reports that she had a stool test performed by her PCP and she was informed that blood was detected in her stool specimen.  Therefore, she is referred here for colonoscopy.  Patient had history of unresectable polyp in the cecum detected on colonoscopy in 2017, underwent right hemicolectomy in 2017.  She has been doing well since then.  She reports that she has symptoms of intermittent rectal pain, pressure, prolapse.  She denies any constipation or diarrhea.  She does have abdominal bloating.  Patient has been very anxious since she was told that the blood was detected in her stool specimen.  She also reports that she has sensitive stomach.  She has history of heartburn, burping and belching which is intermittent.  She takes Pepcid as needed only, although she states she does not like to take medication regularly.  She also want to get an upper endoscopy.  No history of weight loss, loss of appetite, dysphagia.  She denies any abdominal pain.  She is single.  She is planning to go on a cruise with her nieces after these procedures.  NSAIDs: None  Antiplts/Anticoagulants/Anti thrombotics: None  GI Procedures:  EGD 2016 DIAGNOSIS:  A.  GE JUNCTION; COLD BIOPSY:  - SQUAMOCOLUMNAR MUCOSA WITH REFLUX GASTROESOPHAGITIS.  - NEGATIVE FOR INTESTINAL METAPLASIA, DYSPLASIA AND MALIGNANCY.   Colonoscopy 04/02/2016 - One 40 mm polyp in the cecum. Biopsied. - Three 4 to 10 mm polyps in the proximal transverse colon, removed with a  cold snare. Resected and retrieved. Clips were placed. - Diverticulosis in the sigmoid colon and in the distal sigmoid colon.  DIAGNOSIS:  A. 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  - COMPLETE EXCISION IS REQUIRED.   B. COLON POLYP 2, TRANSVERSE; COLD SNARE:  - TUBULAR ADENOMAS (2).  - NEGATIVE FOR HIGH-GRADE DYSPLASIA AND MALIGNANCY.   Right hemicolectomy 05/06/2016  DIAGNOSIS:  A. COLON, RIGHT; RIGHT HEMICOLECTOMY:  - TUBULOVILLOUS ADENOMA WITH HIGH-GRADE DYSPLASIA.  - THIRTEEN BENIGN LYMPH NODES (0/13).  - PROXIMAL AND DISTAL MARGINS ARE NEGATIVE FOR DYSPLASIA AND MALIGNANCY  - UNREMARKABLE APPENDIX.   Past Medical History:  Diagnosis Date   Anxiety    Arthritis    Back pain    Depression    GERD (gastroesophageal reflux disease)    Hypertension    Polycystic disease, ovaries    Vitamin D deficiency     Past Surgical History:  Procedure Laterality Date   ABDOMINAL HYSTERECTOMY     BACK SURGERY     cervical fusion   carpel tunnel     COLONOSCOPY WITH PROPOFOL N/A 04/02/2016   Procedure: COLONOSCOPY WITH PROPOFOL;  Surgeon: Lollie Sails, MD;  Location: [facility] ENDOSCOPY;  Service: Endoscopy;  Laterality: N/A;   COLOSTOMY REVISION Right 05/06/2016   Procedure: COLON RESECTION RIGHT;  Surgeon: Florene Glen, MD;  Location: [facility] ORS;  Service: General;  Laterality: Right;   PARTIAL  HYSTERECTOMY     TUBAL LIGATION      Family History  Problem Relation Age of Onset   Stroke Mother    Kidney disease Mother    Cancer Father    Aneurysm Brother    Breast cancer Neg Hx      Social History   Tobacco Use   Smoking status: Every Day    Packs/day: 0.25    Years: 30.00    Pack years: 7.50    Types: Cigarettes   Smokeless tobacco: Never  Substance Use Topics   Alcohol use: No   Drug use: No    Allergies as of 10/26/2021 - Review Complete 10/26/2021  Allergen Reaction Noted   Nsaids Other (See Comments)  04/01/2016   Skelaxin [metaxalone] Other (See Comments) 04/01/2016    Review of Systems:    All systems reviewed and negative except where noted in HPI.   Physical Exam:  BP 131/80 (BP Location: Left Arm, Patient Position: Sitting, Cuff Size: Normal)    Pulse 93    Temp 99 F (37.2 C) (Oral)    Ht 5\' 6"  (1.676 m)    Wt 156 lb (70.8 kg)    BMI 25.18 kg/m  No LMP recorded. Patient has had a hysterectomy.  General:   Alert,  Well-developed, well-nourished, pleasant and cooperative in NAD Head:  Normocephalic and atraumatic. Eyes:  Sclera clear, no icterus.   Conjunctiva pink. Ears:  Normal auditory acuity. Nose:  No deformity, discharge, or lesions. Mouth:  No deformity or lesions,oropharynx pink & moist. Neck:  Supple; no masses or thyromegaly. Lungs:  Respirations even and unlabored.  Clear throughout to auscultation.   No wheezes, crackles, or rhonchi. No acute distress. Heart:  Regular rate and rhythm; no murmurs, clicks, rubs, or gallops. Abdomen:  Normal bowel sounds. Soft, non-tender and non-distended without masses, hepatosplenomegaly or hernias noted.  No guarding or rebound tenderness.   Rectal: Not performed Msk:  Symmetrical without gross deformities. Good, equal movement & strength bilaterally. Pulses:  Normal pulses noted. Extremities:  No clubbing or edema.  No cyanosis. Neurologic:  Alert and oriented x3;  grossly normal neurologically. Skin:  Intact without significant lesions or rashes. No jaundice. Lymph Nodes:  No significant cervical adenopathy. Psych:  Alert and cooperative. Normal mood and affect.  Imaging Studies: No recent abdominal imaging within last 5 years  Assessment and Plan:   Paula Spencer is a 66 y.o. 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

## 2021-10-30 ENCOUNTER — Other Ambulatory Visit: Payer: Self-pay

## 2021-10-30 NOTE — Progress Notes (Signed)
Patient pharmacy states that the suprep is not covered and needs medication change to Golytely. Changed medication to Golytely

## 2021-11-02 ENCOUNTER — Encounter: Payer: Self-pay | Admitting: Gastroenterology

## 2021-11-03 ENCOUNTER — Ambulatory Visit: Payer: Medicare HMO | Admitting: Certified Registered Nurse Anesthetist

## 2021-11-03 ENCOUNTER — Encounter
Admission: RE | Disposition: A | Discharge status: Home / Self Care | Payer: Self-pay | Source: Home / Self Care | Attending: Gastroenterology

## 2021-11-03 ENCOUNTER — Ambulatory Visit
Admission: RE | Admit: 2021-11-03 | Discharge: 2021-11-03 | Disposition: A | Discharge status: Home / Self Care | Payer: Medicare HMO | Attending: Gastroenterology | Admitting: Gastroenterology

## 2021-11-03 ENCOUNTER — Encounter: Payer: Self-pay | Admitting: Gastroenterology

## 2021-11-03 DIAGNOSIS — Z79899 Other long term (current) drug therapy: Secondary | ICD-10-CM | POA: Insufficient documentation

## 2021-11-03 DIAGNOSIS — F17210 Nicotine dependence, cigarettes, uncomplicated: Secondary | ICD-10-CM | POA: Diagnosis not present

## 2021-11-03 DIAGNOSIS — I10 Essential (primary) hypertension: Secondary | ICD-10-CM | POA: Insufficient documentation

## 2021-11-03 DIAGNOSIS — K219 Gastro-esophageal reflux disease without esophagitis: Secondary | ICD-10-CM | POA: Diagnosis not present

## 2021-11-03 DIAGNOSIS — D126 Benign neoplasm of colon, unspecified: Secondary | ICD-10-CM

## 2021-11-03 DIAGNOSIS — F419 Anxiety disorder, unspecified: Secondary | ICD-10-CM | POA: Insufficient documentation

## 2021-11-03 DIAGNOSIS — K2101 Gastro-esophageal reflux disease with esophagitis, with bleeding: Secondary | ICD-10-CM | POA: Insufficient documentation

## 2021-11-03 DIAGNOSIS — Z980 Intestinal bypass and anastomosis status: Secondary | ICD-10-CM | POA: Insufficient documentation

## 2021-11-03 DIAGNOSIS — R195 Other fecal abnormalities: Secondary | ICD-10-CM

## 2021-11-03 DIAGNOSIS — D122 Benign neoplasm of ascending colon: Secondary | ICD-10-CM | POA: Insufficient documentation

## 2021-11-03 DIAGNOSIS — D124 Benign neoplasm of descending colon: Secondary | ICD-10-CM

## 2021-11-03 DIAGNOSIS — K319 Disease of stomach and duodenum, unspecified: Secondary | ICD-10-CM | POA: Diagnosis not present

## 2021-11-03 DIAGNOSIS — K259 Gastric ulcer, unspecified as acute or chronic, without hemorrhage or perforation: Secondary | ICD-10-CM | POA: Diagnosis not present

## 2021-11-03 DIAGNOSIS — K635 Polyp of colon: Secondary | ICD-10-CM | POA: Diagnosis not present

## 2021-11-03 DIAGNOSIS — K2210 Ulcer of esophagus without bleeding: Secondary | ICD-10-CM

## 2021-11-03 HISTORY — PX: ESOPHAGOGASTRODUODENOSCOPY (EGD) WITH PROPOFOL: SHX5813

## 2021-11-03 HISTORY — PX: COLONOSCOPY WITH PROPOFOL: SHX5780

## 2021-11-03 SURGERY — COLONOSCOPY WITH PROPOFOL
Anesthesia: General

## 2021-11-03 MED ORDER — LIDOCAINE HCL (CARDIAC) PF 100 MG/5ML IV SOSY
PREFILLED_SYRINGE | INTRAVENOUS | Status: DC | PRN
Start: 2021-11-03 — End: 2021-11-03
  Administered 2021-11-03: 50 mg via INTRAVENOUS

## 2021-11-03 MED ORDER — GLYCOPYRROLATE 0.2 MG/ML IJ SOLN
INTRAMUSCULAR | Status: DC | PRN
Start: 2021-11-03 — End: 2021-11-03
  Administered 2021-11-03: .2 mg via INTRAVENOUS

## 2021-11-03 MED ORDER — SODIUM CHLORIDE 0.9 % IV SOLN: INTRAVENOUS | Status: DC

## 2021-11-03 MED ORDER — PROPOFOL 500 MG/50ML IV EMUL
INTRAVENOUS | Status: DC | PRN
  Administered 2021-11-03: 150 ug/kg/min via INTRAVENOUS

## 2021-11-03 MED ORDER — PROPOFOL 10 MG/ML IV BOLUS
INTRAVENOUS | Status: DC | PRN
Start: 2021-11-03 — End: 2021-11-03
  Administered 2021-11-03: 50 mg via INTRAVENOUS

## 2021-11-03 MED ORDER — OMEPRAZOLE 40 MG PO CPDR: 40.0000 mg | DELAYED_RELEASE_CAPSULE | Freq: Every day | ORAL | 2 refills | Status: DC

## 2021-11-03 NOTE — Transfer of Care (Signed)
Immediate Anesthesia Transfer of Care Note  Patient: Paula Spencer  Procedure(s) Performed: COLONOSCOPY WITH PROPOFOL ESOPHAGOGASTRODUODENOSCOPY (EGD) WITH PROPOFOL  Patient Location: Endoscopy Unit  Anesthesia Type:General  Level of Consciousness: drowsy  Airway & Oxygen Therapy: Patient Spontanous Breathing  Post-op Assessment: Report given to RN and Post -op Vital signs reviewed and stable  Post vital signs: Reviewed and stable  Last Vitals:  Vitals Value Taken Time  BP    Temp 36.1 C 11/03/21 1220  Pulse 83 11/03/21 1220  Resp 20 11/03/21 1220  SpO2 96 % 11/03/21 1220    Last Pain:  Vitals:   11/03/21 1220  TempSrc: Temporal  PainSc: Asleep         Complications: No notable events documented.

## 2021-11-03 NOTE — H&P (Signed)
Cephas Darby, MD [address]  [place]  [place], [place] [address]  Main: [phone]  Fax: [phone] Pager: [phone]  Primary Care Physician:  Marguerita Merles, MD Primary Gastroenterologist:  Dr. Cephas Darby  Pre-Procedure History & Physical: HPI:  Paula Spencer is a 66 y.o. female is here for an endoscopy and colonoscopy.   Past Medical History:  Diagnosis Date   Anxiety    Arthritis    Back pain    Depression    GERD (gastroesophageal reflux disease)    Hypertension    Polycystic disease, ovaries    Vitamin D deficiency     Past Surgical History:  Procedure Laterality Date   ABDOMINAL HYSTERECTOMY     BACK SURGERY     cervical fusion   carpel tunnel     COLONOSCOPY WITH PROPOFOL N/A 04/02/2016   Procedure: COLONOSCOPY WITH PROPOFOL;  Surgeon: Lollie Sails, MD;  Location: [facility] ENDOSCOPY;  Service: Endoscopy;  Laterality: N/A;   COLOSTOMY REVISION Right 05/06/2016   Procedure: COLON RESECTION RIGHT;  Surgeon: Florene Glen, MD;  Location: [facility] ORS;  Service: General;  Laterality: Right;   PARTIAL HYSTERECTOMY     TUBAL LIGATION      Prior to Admission medications   Medication Sig Start Date End Date Taking? Authorizing Provider  amLODipine (NORVASC) 10 MG tablet Take by mouth. 08/28/14  Yes [provider]  acetaminophen (TYLENOL) 500 MG tablet Take by mouth.    [provider]  ascorbic acid (VITAMIN C) 1000 MG tablet Take by mouth.    [provider]  Cholecalciferol (VITAMIN D PO) Take 1 tablet by mouth daily. Patient not taking: Reported on 10/26/2021    [provider]  Cholecalciferol 50 MCG (2000 UT) CAPS Take by mouth.    [provider]  cyanocobalamin 500 MCG tablet Take 500 mcg by mouth daily.    [provider]  cyclobenzaprine (FLEXERIL) 10 MG tablet cyclobenzaprine 10 mg tablet    [provider]  diazepam (VALIUM) 5 MG tablet diazepam 5 mg tablet    [provider]  famotidine (PEPCID) 20 MG tablet Take 20 mg by mouth 2 (two) times daily. 10/09/21   [provider]  hydrocortisone (ANUSOL-HC) 25 MG suppository Place 25 mg rectally at bedtime. 09/07/21   [provider]  LORazepam (ATIVAN) 0.5 MG tablet Take by mouth.    [provider]  RETIN-A 0.1 % cream Apply  a small amount to affected area at bedtime 10/06/21   [provider]    Allergies as of 10/26/2021 - Review Complete 10/26/2021  Allergen Reaction Noted   Nsaids Other (See Comments) 04/01/2016   Skelaxin [metaxalone] Other (See Comments) 04/01/2016    Family History  Problem Relation Age of Onset   Stroke Mother    Kidney disease Mother    Cancer Father    Aneurysm Brother    Breast cancer Neg Hx     Social History   Socioeconomic History   Marital status: Single    Spouse name: Not on file   Number of children: Not on file   Years of education: Not on file   Highest education level: Not on file  Occupational History   Not on file  Tobacco Use   Smoking status: Every Day    Packs/day: 0.25    Years: 30.00    Pack years: 7.50    Types: Cigarettes   Smokeless tobacco: Never  Vaping Use  Vaping Use: Never used  Substance and Sexual Activity   Alcohol use: No   Drug use: No   Sexual activity: Not on file  Other Topics Concern   Not on file  Social History Narrative   Not on file   Social Determinants of Health   Financial Resource Strain: Not on file  Food Insecurity: Not on file  Transportation Needs: Not on file  Physical Activity: Not on file  Stress: Not on file  Social Connections: Not on file  Intimate Partner Violence: Not on file    Review of Systems: See HPI, otherwise negative ROS  Physical Exam: BP 125/75    Pulse 95    Temp (!) 96.6 F (35.9 C) (Temporal)    Resp 18    Ht 5\' 6"  (1.676 m)    Wt 70.8 kg    SpO2 100%    BMI 25.18 kg/m  General:   Alert,  pleasant and cooperative in NAD Head:   Normocephalic and atraumatic. Neck:  Supple; no masses or thyromegaly. Lungs:  Clear throughout to auscultation.    Heart:  Regular rate and rhythm. Abdomen:  Soft, nontender and nondistended. Normal bowel sounds, without guarding, and without rebound.   Neurologic:  Alert and  oriented x4;  grossly normal neurologically.  Impression/Plan: Paula Spencer is here for an endoscopy and colonoscopy to be performed for Fecal occult positive test and history of high risk adenoma of the colon s/p right hemicolectomy in 2017, chronic GERD  Risks, benefits, limitations, and alternatives regarding  endoscopy and colonoscopy have been reviewed with the patient.  Questions have been answered.  All parties agreeable.   Sherri Sear, MD  11/03/2021, 11:10 AM

## 2021-11-03 NOTE — Op Note (Signed)
[facility] Gastroenterology Patient Name: Paula Spencer Procedure Date: 11/03/2021 11:43 AM MRN: [id number] Account #: [id number] Date of Birth: 06/02/1956 Admit Type: Outpatient Age: 66 Room: [facility] ENDO ROOM 3 Gender: Female Note Status: Finalized Instrument Name: Upper Endoscope [id number] Procedure:             Upper GI endoscopy Indications:           Esophageal reflux symptoms that persist despite                         appropriate therapy Providers:             Lin Landsman MD, MD Medicines:             General Anesthesia Complications:         No immediate complications. Estimated blood loss: None. Procedure:             Pre-Anesthesia Assessment:                        - Prior to the procedure, a History and Physical was                         performed, and patient medications and allergies were                         reviewed. The patient is competent. The risks and                         benefits of the procedure and the sedation options and                         risks were discussed with the patient. All questions                         were answered and informed consent was obtained.                         Patient identification and proposed procedure were                         verified by the physician, the nurse, the                         anesthesiologist, the anesthetist and the technician                         in the pre-procedure area in the procedure room in the                         endoscopy suite. Mental Status Examination: alert and                         oriented. Airway Examination: normal oropharyngeal                         airway and neck mobility. Respiratory Examination:                         clear to auscultation.  CV Examination: normal.                         Prophylactic Antibiotics: The patient does not require                         prophylactic antibiotics. Prior Anticoagulants: The                          patient has taken no previous anticoagulant or                         antiplatelet agents. ASA Grade Assessment: III - A                         patient with severe systemic disease. After reviewing                         the risks and benefits, the patient was deemed in                         satisfactory condition to undergo the procedure. The                         anesthesia plan was to use general anesthesia.                         Immediately prior to administration of medications,                         the patient was re-assessed for adequacy to receive                         sedatives. The heart rate, respiratory rate, oxygen                         saturations, blood pressure, adequacy of pulmonary                         ventilation, and response to care were monitored                         throughout the procedure. The physical status of the                         patient was re-assessed after the procedure.                        After obtaining informed consent, the endoscope was                         passed under direct vision. Throughout the procedure,                         the patient's blood pressure, pulse, and oxygen                         saturations were monitored continuously. The Endoscope  was introduced through the mouth, and advanced to the                         second part of duodenum. The upper GI endoscopy was                         accomplished without difficulty. The patient tolerated                         the procedure well. Findings:      The duodenal bulb and second portion of the duodenum were normal.      Multiple dispersed diminutive erosions with stigmata of recent bleeding       were found in the gastric body and in the gastric antrum. Biopsies were       taken with a cold forceps for Helicobacter pylori testing.      The cardia and gastric fundus were normal on retroflexion.      Esophagogastric  landmarks were identified: the gastroesophageal junction       was found at 40 cm from the incisors.      LA Grade A (one or more mucosal breaks less than 5 mm, not extending       between tops of 2 mucosal folds) esophagitis with bleeding was found at       the gastroesophageal junction. Impression:            - Normal duodenal bulb and second portion of the                         duodenum.                        - Erosive gastropathy with stigmata of recent                         bleeding. Biopsied.                        - Esophagogastric landmarks identified.                        - LA Grade A reflux esophagitis with bleeding. Recommendation:        - Await pathology results.                        - Follow an antireflux regimen.                        - Use Prilosec (omeprazole) 40 mg PO BID for 3 months.                        - Return to my office in 3 months.                        - Proceed with colonoscopy as scheduled                        See colonoscopy report Procedure Code(s):     --- Professional ---  41937, Esophagogastroduodenoscopy, flexible,                         transoral; with biopsy, single or multiple Diagnosis Code(s):     --- Professional ---                        K92.2, Gastrointestinal hemorrhage, unspecified                        K21.01, Gastro-esophageal reflux disease with                         esophagitis, with bleeding CPT copyright 2019 American Medical Association. All rights reserved. The codes documented in this report are preliminary and upon coder review may  be revised to meet current compliance requirements. Dr. Ulyess Mort Lin Landsman MD, MD 11/03/2021 11:54:18 AM This report has been signed electronically. Number of Addenda: 0 Note Initiated On: 11/03/2021 11:43 AM Estimated Blood Loss:  Estimated blood loss: none.      [facility]

## 2021-11-03 NOTE — Op Note (Signed)
[facility] Patient Name: Paula Spencer Procedure Date: 11/03/2021 11:43 AM MRN: [id number] Account #: [id number] Date of Birth: 1956-08-30 Admit Type: Outpatient Age: 66 Room: [facility] ENDO ROOM 3 Gender: Female Note Status: Finalized Instrument Name: Colonscope [id number] Procedure:             Colonoscopy Indications:           Last colonoscopy: July 2017, Positive fecal                         immunochemical test Providers:             Lin Landsman MD, MD Medicines:             General Anesthesia Complications:         No immediate complications. Estimated blood loss: None. Procedure:             Pre-Anesthesia Assessment:                        - Prior to the procedure, a History and Physical was                         performed, and patient medications and allergies were                         reviewed. The patient is competent. The risks and                         benefits of the procedure and the sedation options and                         risks were discussed with the patient. All questions                         were answered and informed consent was obtained.                         Patient identification and proposed procedure were                         verified by the physician, the nurse, the                         anesthesiologist, the anesthetist and the technician                         in the pre-procedure area in the procedure room in the                         endoscopy suite. Mental Status Examination: alert and                         oriented. Airway Examination: normal oropharyngeal                         airway and neck mobility. Respiratory Examination:                         clear to auscultation. CV Examination: normal.  Prophylactic Antibiotics: The patient does not require                         prophylactic antibiotics. Prior Anticoagulants: The                         patient has  taken no previous anticoagulant or                         antiplatelet agents. ASA Grade Assessment: III - A                         patient with severe systemic disease. After reviewing                         the risks and benefits, the patient was deemed in                         satisfactory condition to undergo the procedure. The                         anesthesia plan was to use general anesthesia.                         Immediately prior to administration of medications,                         the patient was re-assessed for adequacy to receive                         sedatives. The heart rate, respiratory rate, oxygen                         saturations, blood pressure, adequacy of pulmonary                         ventilation, and response to care were monitored                         throughout the procedure. The physical status of the                         patient was re-assessed after the procedure.                        After obtaining informed consent, the colonoscope was                         passed under direct vision. Throughout the procedure,                         the patient's blood pressure, pulse, and oxygen                         saturations were monitored continuously. The                         Colonoscope was introduced through the anus and  advanced to the the ileocolonic anastomosis. The                         colonoscopy was performed without difficulty. The                         patient tolerated the procedure well. The quality of                         the bowel preparation was evaluated using the BBPS                         [facility] Bowel Preparation Scale) with scores of: Right                         Colon = 3, Transverse Colon = 3 and Left Colon = 3                         (entire mucosa seen well with no residual staining,                         small fragments of stool or opaque liquid). The total                          BBPS score equals 9. Findings:      The perianal and digital rectal examinations were normal. Pertinent       negatives include normal sphincter tone and no palpable rectal lesions.      There was evidence of a prior end-to-side ileo-colonic anastomosis in       the ascending colon. This was patent and was characterized by healthy       appearing mucosa. The anastomosis was traversed.      Two sessile polyps were found in the ascending colon. The polyps were       diminutive in size. These polyps were removed with a cold biopsy       forceps. Resection and retrieval were complete. Estimated blood loss:       none.      A 4 mm polyp was found in the descending colon. The polyp was sessile.       The polyp was removed with a cold snare. Resection and retrieval were       complete.      A 15 mm polyp was found in the descending colon. The polyp was       semi-pedunculated. The polyp was removed with a hot snare. Resection and       retrieval were complete. Estimated blood loss: none.      The retroflexed view of the distal rectum and anal verge was normal and       showed no anal or rectal abnormalities. Impression:            - Patent end-to-side ileo-colonic anastomosis,                         characterized by healthy appearing mucosa.                        - Two diminutive polyps in the ascending colon,  removed with a cold biopsy forceps. Resected and                         retrieved.                        - One 4 mm polyp in the descending colon, removed with                         a cold snare. Resected and retrieved.                        - One 15 mm polyp in the descending colon, removed                         with a hot snare. Resected and retrieved.                        - The distal rectum and anal verge are normal on                         retroflexion view. Recommendation:        - Discharge patient to home (with escort).                         - Resume previous diet today.                        - Continue present medications.                        - Await pathology results.                        - Repeat colonoscopy in 3 years for surveillance of                         multiple polyps. Procedure Code(s):     --- Professional ---                        [phone], Colonoscopy, flexible; with removal of                         tumor(s), polyp(s), or other lesion(s) by snare                         technique                        58099, 63, Colonoscopy, flexible; with biopsy, single                         or multiple Diagnosis Code(s):     --- Professional ---                        Z98.0, Intestinal bypass and anastomosis status                        K63.5, Polyp of colon  R19.5, Other fecal abnormalities CPT copyright 2019 American Medical Association. All rights reserved. The codes documented in this report are preliminary and upon coder review may  be revised to meet current compliance requirements. Dr. Ulyess Mort Lin Landsman MD, MD 11/03/2021 12:22:44 PM This report has been signed electronically. Number of Addenda: 0 Note Initiated On: 11/03/2021 11:43 AM Scope Withdrawal Time: 0 hours 17 minutes 15 seconds  Total Procedure Duration: 0 hours 20 minutes 20 seconds  Estimated Blood Loss:  Estimated blood loss: none.      [facility]

## 2021-11-03 NOTE — Anesthesia Postprocedure Evaluation (Signed)
Anesthesia Post Note  Patient: [name]  Procedure(s) Performed: COLONOSCOPY WITH PROPOFOL ESOPHAGOGASTRODUODENOSCOPY (EGD) WITH PROPOFOL  Patient location during evaluation: Endoscopy Anesthesia Type: General Level of consciousness: awake and alert Pain management: pain level controlled Vital Signs Assessment: post-procedure vital signs reviewed and stable Respiratory status: spontaneous breathing, nonlabored ventilation, respiratory function stable and patient connected to nasal cannula oxygen Cardiovascular status: blood pressure returned to baseline and stable Postop Assessment: no apparent nausea or vomiting Anesthetic complications: no   No notable events documented.   Last Vitals:  Vitals:   11/03/21 1230 11/03/21 1240  BP: 111/71 126/81  Pulse: 84 79  Resp: (!) 21 17  Temp:    SpO2: 97% 98%    Last Pain:  Vitals:   11/03/21 1240  TempSrc:   PainSc: 0-No pain                 Precious Haws Oaklee Sunga

## 2021-11-03 NOTE — Anesthesia Preprocedure Evaluation (Signed)
Anesthesia Evaluation  Patient identified by MRN, date of birth, ID band Patient awake    Reviewed: Allergy & Precautions, NPO status , Patient's Chart, lab work & pertinent test results  History of Anesthesia Complications Negative for: history of anesthetic complications  Airway Mallampati: III  TM Distance: >3 FB Neck ROM: full    Dental  (+) Chipped, Upper Dentures, Partial Lower   Pulmonary neg shortness of breath, Current Smoker,    Pulmonary exam normal        Cardiovascular hypertension, (-) angina(-) DOE Normal cardiovascular exam     Neuro/Psych PSYCHIATRIC DISORDERS negative neurological ROS  negative psych ROS   GI/Hepatic Neg liver ROS, GERD  Medicated and Controlled,  Endo/Other  negative endocrine ROS  Renal/GU Renal disease  negative genitourinary   Musculoskeletal  (+) Arthritis ,   Abdominal   Peds  Hematology negative hematology ROS (+)   Anesthesia Other Findings Past Medical History: No date: Anxiety No date: Arthritis No date: Back pain No date: Depression No date: GERD (gastroesophageal reflux disease) No date: Hypertension No date: Polycystic disease, ovaries No date: Vitamin D deficiency  Past Surgical History: No date: ABDOMINAL HYSTERECTOMY No date: BACK SURGERY     Comment:  cervical fusion No date: carpel tunnel 04/02/2016: COLONOSCOPY WITH PROPOFOL; N/A     Comment:  Procedure: COLONOSCOPY WITH PROPOFOL;  Surgeon: Lollie Sails, MD;  Location: [facility] ENDOSCOPY;  Service:               Endoscopy;  Laterality: N/A; 05/06/2016: COLOSTOMY REVISION; Right     Comment:  Procedure: COLON RESECTION RIGHT;  Surgeon: Florene Glen, MD;  Location: [facility] ORS;  Service: General;                Laterality: Right; No date: PARTIAL HYSTERECTOMY No date: TUBAL LIGATION  BMI    Body Mass Index: 25.18 kg/m      Reproductive/Obstetrics negative OB ROS                              Anesthesia Physical Anesthesia Plan  ASA: 3  Anesthesia Plan: General   Post-op Pain Management:    Induction: Intravenous  PONV Risk Score and Plan: Propofol infusion and TIVA  Airway Management Planned: Natural Airway and Nasal Cannula  Additional Equipment:   Intra-op Plan:   Post-operative Plan:   Informed Consent: I have reviewed the patients History and Physical, chart, labs and discussed the procedure including the risks, benefits and alternatives for the proposed anesthesia with the patient or authorized representative who has indicated his/her understanding and acceptance.     Dental Advisory Given  Plan Discussed with: Anesthesiologist, CRNA and Surgeon  Anesthesia Plan Comments: (Patient consented for risks of anesthesia including but not limited to:  - adverse reactions to medications - risk of airway placement if required - damage to eyes, teeth, lips or other oral mucosa - nerve damage due to positioning  - sore throat or hoarseness - Damage to heart, brain, nerves, lungs, other parts of body or loss of life  Patient voiced understanding.)        Anesthesia Quick Evaluation

## 2021-11-04 ENCOUNTER — Encounter: Payer: Self-pay | Admitting: Gastroenterology

## 2021-11-04 LAB — SURGICAL PATHOLOGY

## 2021-11-05 ENCOUNTER — Ambulatory Visit: Payer: Medicare HMO | Admitting: Gastroenterology

## 2021-11-05 ENCOUNTER — Telehealth: Payer: Self-pay

## 2021-11-05 ENCOUNTER — Encounter: Payer: Self-pay | Admitting: Gastroenterology

## 2021-11-05 NOTE — Telephone Encounter (Signed)
Called and left a message for call back. Sent mychart message  °

## 2021-11-05 NOTE — Telephone Encounter (Signed)
-----   Message from Lin Landsman, MD sent at 11/05/2021  3:40 PM EST ----- Please inform patient that the pathology results from upper endoscopy came back normal.  She should continue taking omeprazole 40 mg once a day for reflux esophagitis at least for 3 months  RV

## 2022-01-29 ENCOUNTER — Telehealth: Payer: Self-pay | Admitting: Gastroenterology

## 2022-01-29 NOTE — Telephone Encounter (Signed)
Omeprazole was refill on 11/03/21 for 2 refills on path report it said Your pathology results from upper endoscopy came back normal. You should continue taking omeprazole 40 mg once a day for reflux esophagitis at least for 3 months.  ? ?Please advise if patient still needs medication  ?

## 2022-01-29 NOTE — Telephone Encounter (Signed)
Patient called requesting a refill be sent in for the omeprazole 40 mg. Requesting a call back to get an update.  ?

## 2022-01-29 NOTE — Telephone Encounter (Signed)
I recommend patient to try low-dose omeprazole 20 mg once daily before meals and see if it helps.  Have her give it a try for 2 weeks and call us back if her reflux symptoms are persistent ? ?RV ?

## 2022-02-01 MED ORDER — OMEPRAZOLE 20 MG PO CPDR: 20.0000 mg | DELAYED_RELEASE_CAPSULE | Freq: Every day | ORAL | 1 refills | Status: DC

## 2022-02-01 NOTE — Addendum Note (Signed)
Addended by: Ulyess Blossom L on: 02/01/2022 09:20 AM ? ? Modules accepted: Orders ? ?

## 2022-02-01 NOTE — Telephone Encounter (Signed)
Patient verbalized understanding and sent medication to the pharmacy  

## 2022-02-11 DIAGNOSIS — H8110 Benign paroxysmal vertigo, unspecified ear: Secondary | ICD-10-CM | POA: Insufficient documentation

## 2022-04-06 ENCOUNTER — Telehealth: Payer: Self-pay | Admitting: Gastroenterology

## 2022-04-06 NOTE — Telephone Encounter (Signed)
Pt is requesting refill on omeprazole 20 mg would like a call back [phone].

## 2022-04-13 MED ORDER — OMEPRAZOLE 20 MG PO CPDR: 20.0000 mg | DELAYED_RELEASE_CAPSULE | Freq: Every day | ORAL | 3 refills | Status: DC

## 2022-04-13 NOTE — Addendum Note (Signed)
Addended by: Ulyess Blossom L on: 04/13/2022 09:27 AM   Modules accepted: Orders

## 2022-04-13 NOTE — Telephone Encounter (Signed)
Sent medication to the pharmacy and patient verbalized understanding

## 2022-04-13 NOTE — Telephone Encounter (Signed)
Patient is wondering if Dr Marius Ditch wants her to keep taking the omeprazole 20 mg. Please return patients call.

## 2022-04-13 NOTE — Telephone Encounter (Signed)
Please advise if she needs to be taking omeprazole

## 2022-04-13 NOTE — Telephone Encounter (Signed)
She can continue omeprazole 20 mg daily before breakfast long-term.  Consider low-dose medication and it is also available over-the-counter.  Please send in a prescription if covered by her insurance.  I also recommended that she should give a drug holiday taking omeprazole to check whether or not her reflux symptoms return  Sherri Sear, MD

## 2022-04-19 ENCOUNTER — Other Ambulatory Visit: Payer: Self-pay | Admitting: Family Medicine

## 2022-04-19 DIAGNOSIS — Z1231 Encounter for screening mammogram for malignant neoplasm of breast: Secondary | ICD-10-CM

## 2022-05-07 DIAGNOSIS — M25521 Pain in right elbow: Secondary | ICD-10-CM | POA: Insufficient documentation

## 2022-05-11 ENCOUNTER — Ambulatory Visit
Admission: RE | Admit: 2022-05-11 | Discharge: 2022-05-11 | Disposition: A | Discharge status: Home / Self Care | Payer: Medicare HMO | Source: Ambulatory Visit | Attending: Family Medicine | Admitting: Family Medicine

## 2022-05-11 DIAGNOSIS — Z1231 Encounter for screening mammogram for malignant neoplasm of breast: Secondary | ICD-10-CM | POA: Diagnosis present

## 2022-05-27 DIAGNOSIS — L720 Epidermal cyst: Secondary | ICD-10-CM | POA: Insufficient documentation

## 2022-06-29 ENCOUNTER — Ambulatory Visit: Payer: Medicare HMO | Admitting: Adult Health

## 2022-06-29 ENCOUNTER — Encounter: Payer: Self-pay | Admitting: Adult Health

## 2022-06-29 VITALS — BP 121/69 | HR 106 | Ht 65.5 in | Wt 158.0 lb

## 2022-06-29 DIAGNOSIS — N941 Unspecified dyspareunia: Secondary | ICD-10-CM

## 2022-06-29 DIAGNOSIS — Z90710 Acquired absence of both cervix and uterus: Secondary | ICD-10-CM | POA: Diagnosis not present

## 2022-06-29 DIAGNOSIS — N898 Other specified noninflammatory disorders of vagina: Secondary | ICD-10-CM | POA: Diagnosis not present

## 2022-06-29 NOTE — Progress Notes (Signed)
  Subjective:     Patient ID: Paula Spencer, female   DOB: 1956-01-26, 66 y.o.   MRN: [id number]  HPI Paula Spencer is a 66 year old black female, single, sp hysterectomy, in complaining of vaginal moisture. She was prescribed premarin vaginal cream but did not want to use. She does have sex but not often, maybe once a month.  PCP is Dr Lennox Grumbles.   Review of Systems +vaginal dryness +discomfort with sex at introitus Reviewed past medical,surgical, social and family history. Reviewed medications and allergies.     Objective:   Physical Exam BP 121/69 (BP Location: Left Arm, Patient Position: Sitting, Cuff Size: Normal)   Pulse (!) 106   Ht 5' 5.5" (1.664 m)   Wt 158 lb (71.7 kg)   BMI 25.89 kg/m     Skin warm and dry.Pelvic: external genitalia is normal in appearance no lesions, vagina:pale with loss of moisture and rugae,urethra has no lesions or masses noted, cervix and uterus are absent, adnexa: no masses or tenderness noted. Bladder is non tender and no masses felt. AA is 0 Fall risk is low    06/29/2022    9:05 AM  Depression screen PHQ 2/9  Decreased Interest 0  Down, Depressed, Hopeless 1  PHQ - 2 Score 1  Altered sleeping 3  Tired, decreased energy 2  Change in appetite 0  Feeling bad or failure about yourself  0  Trouble concentrating 0  Moving slowly or fidgety/restless 0  Suicidal thoughts 0  PHQ-9 Score 6       06/29/2022    9:05 AM  GAD 7 : Generalized Anxiety Score  Nervous, Anxious, on Edge 1  Control/stop worrying 1  Worry too much - different things 1  Trouble relaxing 1  Restless 0  Easily annoyed or irritable 0  Afraid - awful might happen 1  Total GAD 7 Score 5    Upstream - 06/29/22 0915       Pregnancy Intention Screening   Does the patient want to become pregnant in the next year? N/A    Does the patient's partner want to become pregnant in the next year? N/A    Would the patient like to discuss contraceptive options today? N/A       Contraception Wrap Up   Current Method Female Sterilization   hyst   End Method Female Sterilization   hyst           Examination chaperoned by Paula Pupa LPN    Assessment:      1. Vaginal dryness Try replens, or luvena for vaginal moisture   2. S/P hysterectomy  3. Dyspareunia, female Get vaginal dilator or vibrator to use more often than having sex, to help with stretching vagina  Increase foreplay Get good lubricate like astroglide,use on him too      Plan:     Follow up prn

## 2022-11-27 IMAGING — MG MM DIGITAL SCREENING BILAT W/ TOMO AND CAD
8 series · 8 of 24 positions shown · non-contrast
Comparison: Previous exam(s).

CLINICAL DATA: Screening.

EXAM:
DIGITAL SCREENING BILATERAL MAMMOGRAM WITH TOMOSYNTHESIS AND CAD
TECHNIQUE: Bilateral screening digital craniocaudal and mediolateral oblique
mammograms were obtained. Bilateral screening digital breast
tomosynthesis was performed. The images were evaluated with
computer-aided detection.

[R MLO synth-2D]
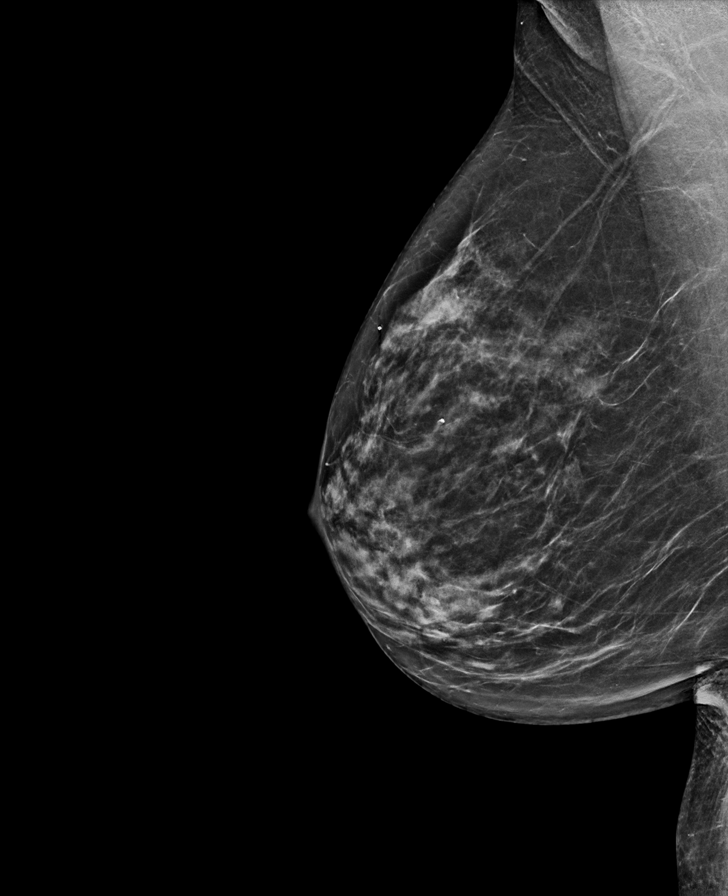

[R CC synth-2D]
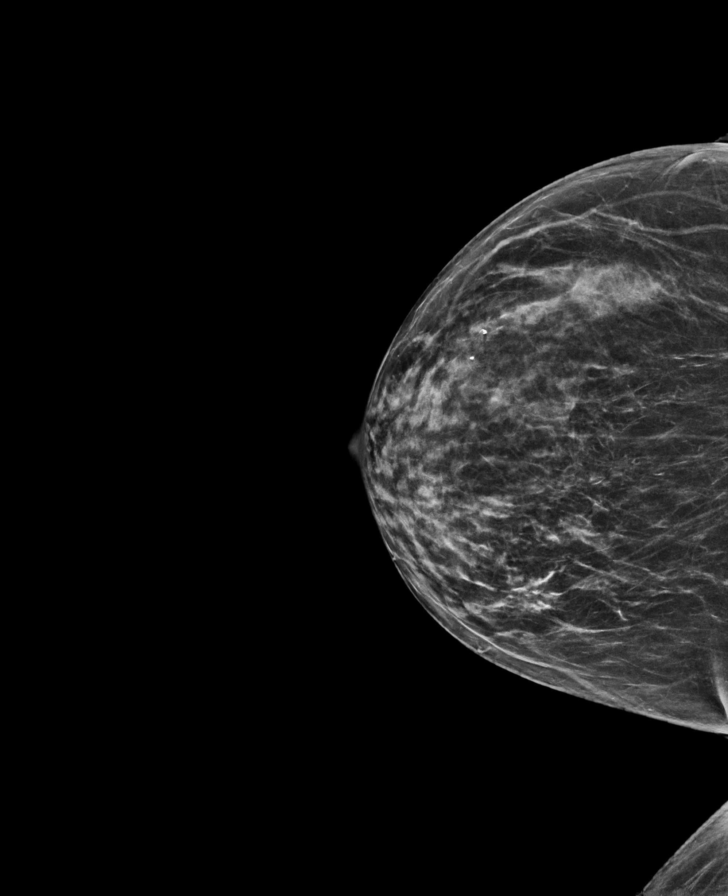

[L MLO synth-2D]
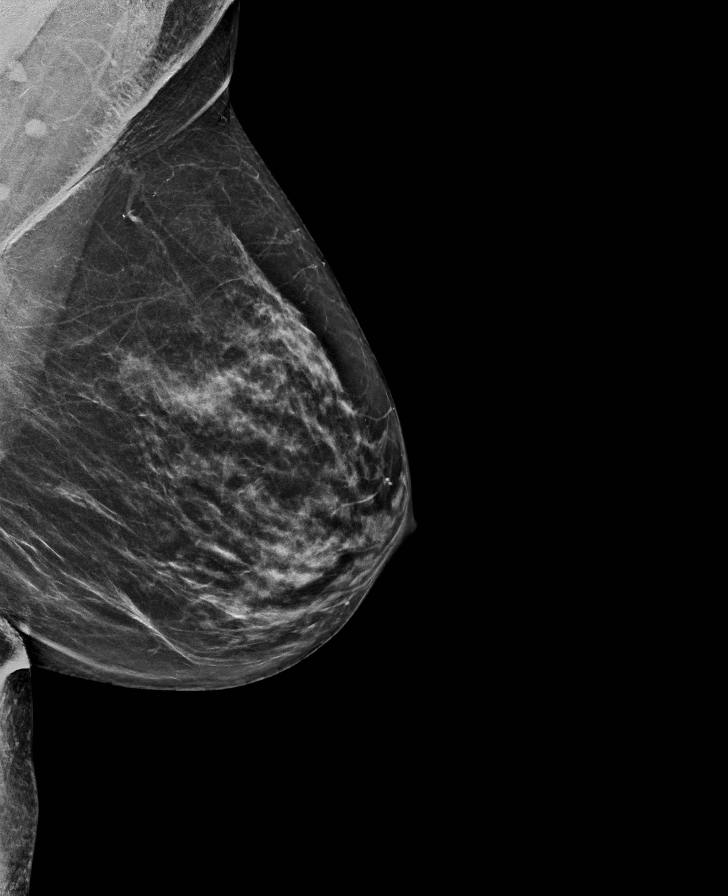

[L CC synth-2D]
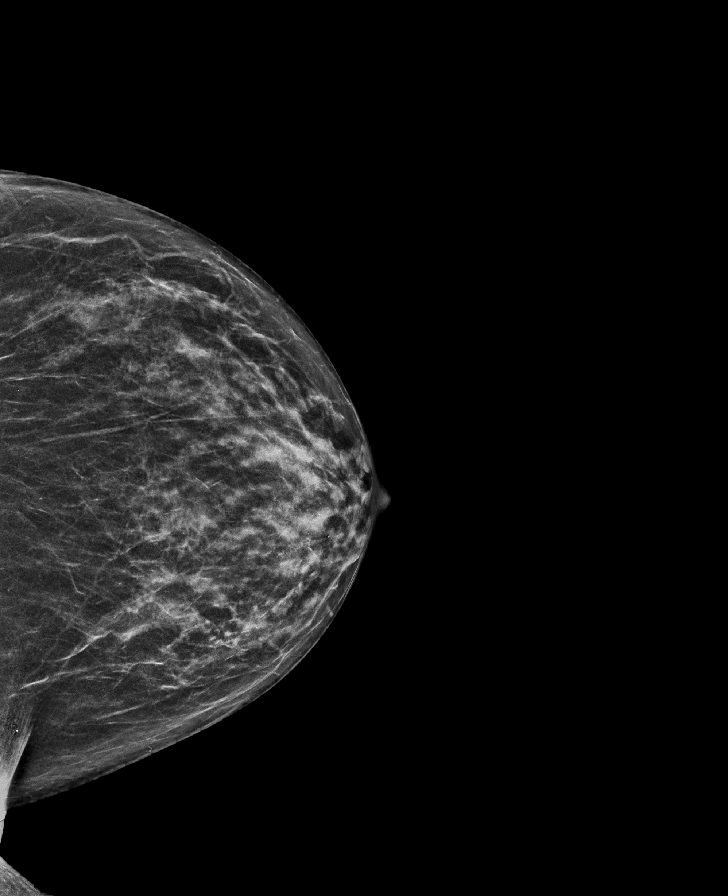

[L MLO tomo · tomo slice 31/60.0]
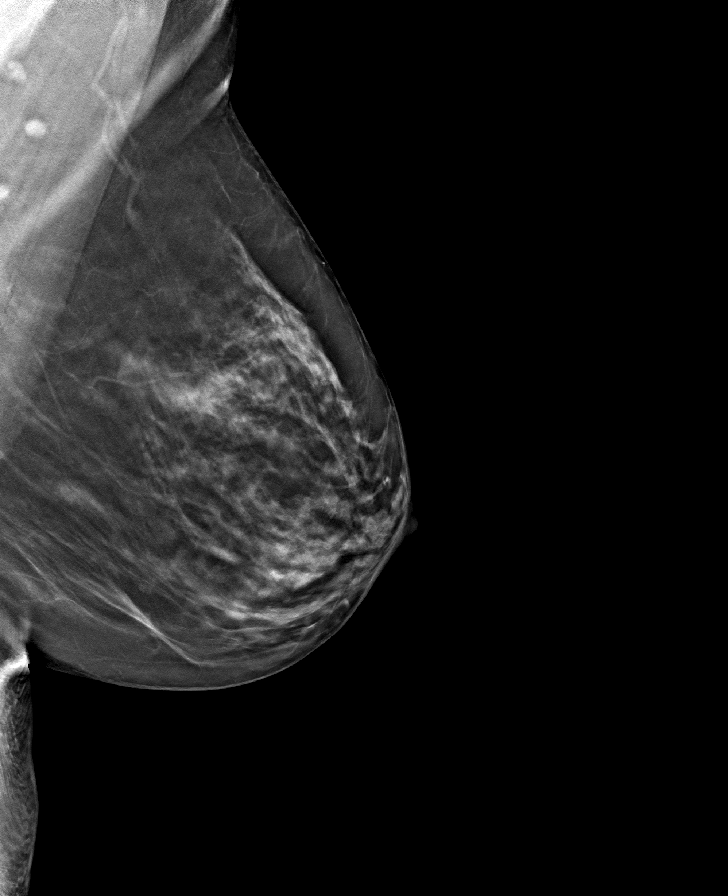

[R MLO tomo · tomo slice 33/64.0]
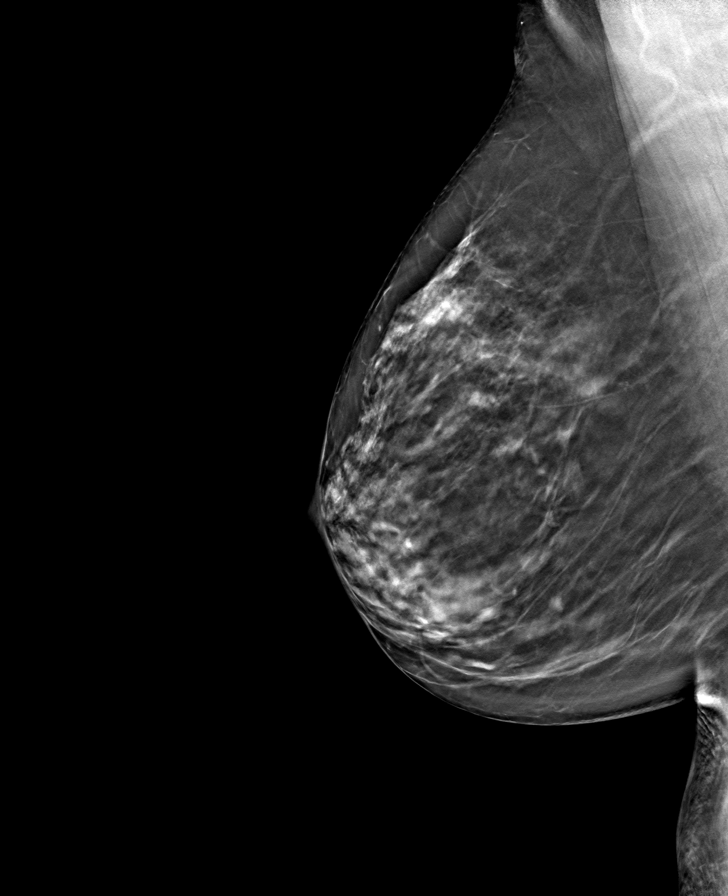

[L CC tomo · tomo slice 28/55.0]
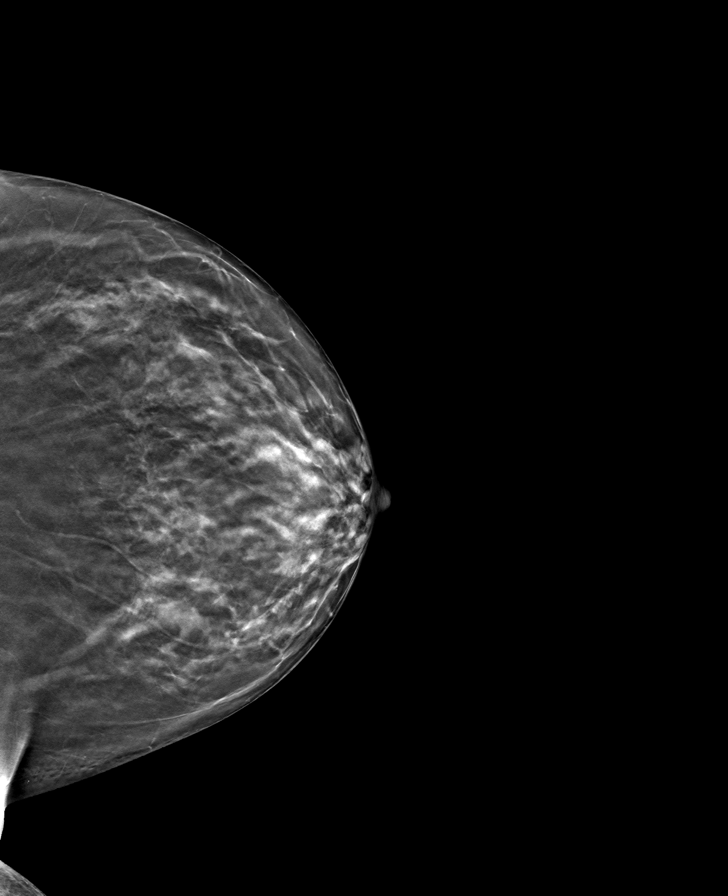

[R CC tomo · tomo slice 31/61.0]
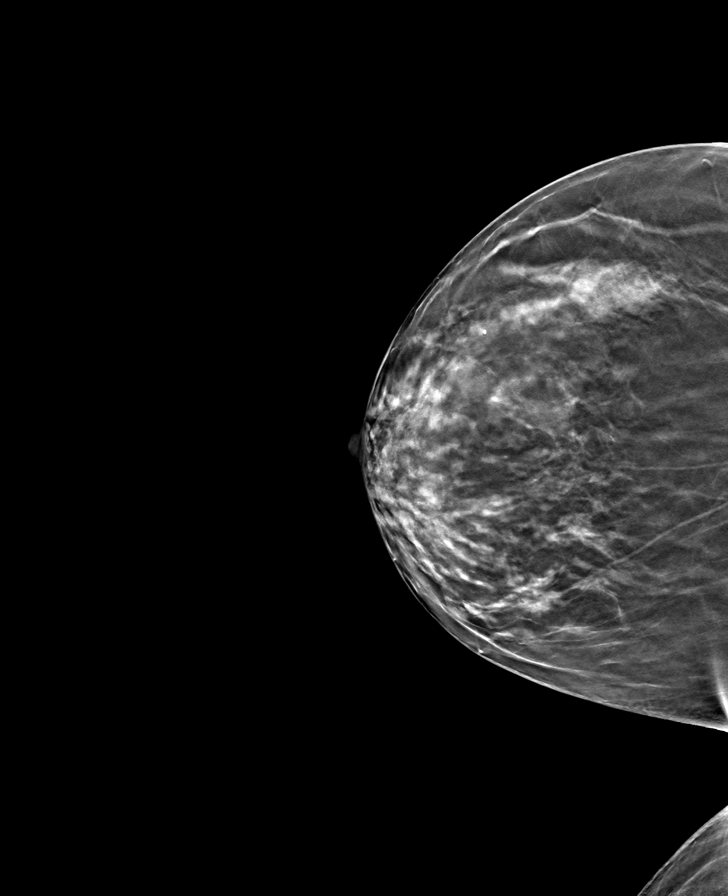

[8 of 24 positions shown; findings below may reference images not displayed]

ACR Breast Density Category c: The breast tissue is heterogeneously
dense, which may obscure small masses.
FINDINGS: There are no findings suspicious for malignancy. The images were
evaluated with computer-aided detection.
IMPRESSION: No mammographic evidence of malignancy. A result letter of this
screening mammogram will be mailed directly to the patient.

RECOMMENDATION:
Screening mammogram in one year. (Code:T4-5-GWO)

BI-RADS CATEGORY  1: Negative.

## 2023-04-11 ENCOUNTER — Other Ambulatory Visit: Payer: Self-pay | Admitting: Family Medicine

## 2023-04-11 DIAGNOSIS — Z1231 Encounter for screening mammogram for malignant neoplasm of breast: Secondary | ICD-10-CM

## 2023-05-24 ENCOUNTER — Ambulatory Visit
Admission: RE | Admit: 2023-05-24 | Discharge: 2023-05-24 | Disposition: A | Discharge status: Home / Self Care | Payer: Medicare HMO | Source: Ambulatory Visit | Attending: Family Medicine | Admitting: Family Medicine

## 2023-05-24 DIAGNOSIS — Z1231 Encounter for screening mammogram for malignant neoplasm of breast: Secondary | ICD-10-CM | POA: Insufficient documentation

## 2023-05-26 ENCOUNTER — Telehealth: Payer: Self-pay | Admitting: Gastroenterology

## 2023-05-26 ENCOUNTER — Other Ambulatory Visit: Payer: Self-pay | Admitting: Gastroenterology

## 2023-05-26 NOTE — Telephone Encounter (Signed)
Patient called in to get a refill.

## 2023-05-26 NOTE — Telephone Encounter (Signed)
Last office visit 10/26/2021 chronic gerd   Last refill 04/13/2022 3 refills   Called and got patient schedule with the PA on 07/06/2023

## 2023-07-01 ENCOUNTER — Other Ambulatory Visit: Payer: Self-pay

## 2023-07-06 ENCOUNTER — Encounter: Payer: Self-pay | Admitting: Physician Assistant

## 2023-07-06 ENCOUNTER — Ambulatory Visit: Payer: Medicare HMO | Admitting: Physician Assistant

## 2023-07-06 ENCOUNTER — Telehealth: Payer: Self-pay

## 2023-07-06 VITALS — BP 121/74 | HR 81 | Temp 98.2°F | Ht 66.0 in | Wt 159.8 lb

## 2023-07-06 DIAGNOSIS — K2100 Gastro-esophageal reflux disease with esophagitis, without bleeding: Secondary | ICD-10-CM | POA: Diagnosis not present

## 2023-07-06 DIAGNOSIS — K2970 Gastritis, unspecified, without bleeding: Secondary | ICD-10-CM | POA: Diagnosis not present

## 2023-07-06 DIAGNOSIS — K2960 Other gastritis without bleeding: Secondary | ICD-10-CM

## 2023-07-06 DIAGNOSIS — Z860100 Personal history of colon polyps, unspecified: Secondary | ICD-10-CM

## 2023-07-06 DIAGNOSIS — Z860101 Personal history of adenomatous and serrated colon polyps: Secondary | ICD-10-CM

## 2023-07-06 MED ORDER — OMEPRAZOLE 20 MG PO CPDR: 20.0000 mg | DELAYED_RELEASE_CAPSULE | Freq: Every day | ORAL | 3 refills | Status: DC

## 2023-07-06 NOTE — Progress Notes (Signed)
Dr. Raynald Kemp took care of now and does not do okay so I talked to her about this   Celso Amy, PA-C [address]  [address]  [place], [place] [address]  Main: [phone]  Fax: [phone]   Primary Care Physician: Leanna Sato, MD  Primary Gastroenterologist:  Celso Amy, PA-C / Dr. Lannette Donath    CC: Follow-up GERD, med refill  HPI: Paula Spencer is a 67 y.o. female, established patient of Dr. Allegra Lai, presents for follow-up of GERD.  She last saw Dr. Allegra Lai for office visit 09/2021.  She is currently taking omeprazole 20 Mg daily for chronic GERD and needs medication refill. She ran out of omeprazole and noticed increased globus sensation.  Recently saw ENT and laryngoscopy was unrevealing.  She has noticed reflux of acid and is requesting refill prescription for omeprazole.  Admits to tobacco use (cigarettes) and occasional wine.  Denies NSAIDs.  Denies abdominal pain, bowel irregularities, hematochezia, melena, or any other GI symptoms.  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  Last EGD by Dr. Allegra Lai showed erosive gastropathy, LA grade A esophagitis, normal duodenum.  Biopsies negative for H. pylori.  She was treated with Prilosec 40 Mg twice daily for 3 months.  Last colonoscopy 10/2021 showed 2 diminutive polyps removed from the ascending colon, one 4 mm polyp removed from descending colon, and a 15 mm polyp removed from descending colon.  Pathology showed sessile serrated polyp and 3 tubular adenomas.  No dysplasia.  Good prep.  3-year repeat.  Current Outpatient Medications  Medication Sig Dispense Refill   acetaminophen (TYLENOL) 500 MG tablet Take by mouth.     amLODipine (NORVASC) 10 MG tablet Take by mouth.     CALCIUM PO Take by mouth. With Vit D3-takes BID     LORazepam (ATIVAN) 1 MG tablet Take 1 mg by mouth as needed for anxiety.     MAGNESIUM PO Take 400 mg by mouth. Takes BID     meloxicam  (MOBIC) 15 MG tablet Take 15 mg by mouth daily.     Triamcinolone Acetonide (NASACORT AQ NA) Place into the nose.     omeprazole (PRILOSEC) 20 MG capsule Take 1 capsule (20 mg total) by mouth daily. 90 capsule 3   No current facility-administered medications for this visit.    Allergies as of 07/06/2023 - Review Complete 07/06/2023  Allergen Reaction Noted   Nsaids Other (See Comments) 04/01/2016   Skelaxin [metaxalone] Other (See Comments) 04/01/2016    Past Medical History:  Diagnosis Date   Anxiety    Arthritis    Back pain    Depression    GERD (gastroesophageal reflux disease)    Hypertension    Polycystic disease, ovaries    Vitamin D deficiency     Past Surgical History:  Procedure Laterality Date   ABDOMINAL HYSTERECTOMY     BACK SURGERY     cervical fusion   carpel tunnel     COLONOSCOPY WITH PROPOFOL N/A 04/02/2016   Procedure: COLONOSCOPY WITH PROPOFOL;  Surgeon: Christena Deem, MD;  Location: [facility] ENDOSCOPY;  Service: Endoscopy;  Laterality: N/A;   COLONOSCOPY WITH PROPOFOL N/A 11/03/2021   Procedure: COLONOSCOPY WITH PROPOFOL;  Surgeon: Toney Reil, MD;  Location: [facility] ENDOSCOPY;  Service: Gastroenterology;  Laterality: N/A;   COLOSTOMY REVISION Right 05/06/2016   Procedure: COLON RESECTION RIGHT;  Surgeon: Lattie Haw, MD;  Location: [facility] ORS;  Service: General;  Laterality: Right;   ESOPHAGOGASTRODUODENOSCOPY (EGD) WITH PROPOFOL N/A 11/03/2021   Procedure: ESOPHAGOGASTRODUODENOSCOPY (EGD) WITH PROPOFOL;  Surgeon: Toney Reil, MD;  Location: [facility] ENDOSCOPY;  Service: Gastroenterology;  Laterality: N/A;   PARTIAL HYSTERECTOMY     TOTAL HIP ARTHROPLASTY Left 05/14/2021   TUBAL LIGATION      Review of Systems:    All systems reviewed and negative except where noted in HPI.   Physical Examination:   BP 121/74   Pulse 81   Temp 98.2 F (36.8 C)   Ht 5\' 6"  (1.676 m)   Wt 159 lb 12.8 oz (72.5 kg)   BMI 25.79 kg/m   General:  Well-nourished, well-developed in no acute distress.  Lungs: Clear to auscultation bilaterally. Non-labored. Heart: Regular rate and rhythm, no murmurs rubs or gallops.  Abdomen: Bowel sounds are normal; Abdomen is Soft; No hepatosplenomegaly, masses or hernias;  No Abdominal Tenderness; No guarding or rebound tenderness. Neuro: Alert and oriented x 3.  Grossly intact.  Psych: Alert and cooperative, normal mood and affect.   Imaging Studies: No results found.  Assessment and Plan:   Paula Spencer is a 67 y.o. y/o female returns for follow-up chronic GERD with esophagitis, erosive gastritis, and history of adenomatous polyps.  1.  Chronic GERD with esophagitis (LA Grade A)  Continue omeprazole 20 Mg once daily.  Add Pepcid or Tums as needed  Recommend Lifestyle Modifications to prevent Acid Reflux.  Rec. Avoid coffee, sodas, peppermint, citrus fruits, and spicey foods.  Avoid eating 2-3 hours before bedtime.   2.  Erosive gastritis; biopsies negative for H. Pylori  Continue omeprazole 20 Mg once daily.  Avoid NSAIDs  3.  History of adenomatous colon polyps.  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  Last colonoscopy 10/2021 showed multiple adenomatous polyps with no dysplasia.  3-year repeat colonoscopy will be due 10/2024.    Celso Amy, PA-C  Follow up 1 year.

## 2023-07-06 NOTE — Telephone Encounter (Signed)
Received message from patient-she wanted to schedule colonoscopy-spoke with Inetta Fermo PA and patient is not due until February 2026. Patient verbalized understanding

## 2023-08-17 ENCOUNTER — Ambulatory Visit: Payer: Medicare HMO | Admitting: Physician Assistant

## 2023-08-31 ENCOUNTER — Ambulatory Visit: Payer: Medicare HMO | Admitting: Orthopedic Surgery

## 2023-08-31 ENCOUNTER — Other Ambulatory Visit (INDEPENDENT_AMBULATORY_CARE_PROVIDER_SITE_OTHER): Payer: Self-pay

## 2023-08-31 VITALS — BP 139/85 | HR 99 | Ht 66.0 in | Wt 157.8 lb

## 2023-08-31 DIAGNOSIS — M542 Cervicalgia: Secondary | ICD-10-CM | POA: Diagnosis not present

## 2023-08-31 NOTE — Progress Notes (Signed)
Office Visit Note   Patient: Paula Spencer           Date of Birth: 1956/03/06           MRN: [id number] Visit Date: 08/31/2023 Requested by: Leanna Sato, MD [address] [place],  [place] [address] PCP: Leanna Sato, MD   Assessment & Plan:  67 year old female with cervical spasms  Recommend meloxicam and cyclobenzaprine and heating pad as she does not want to take any medication  Recheck 6 weeks    Encounter Diagnosis  Name Primary?   Neck pain Yes    No orders of the defined types were placed in this encounter.    Subjective: Chief Complaint  Patient presents with   Neck Pain    Pain or "pulling" in back of neck and left shoulder constant. Has good motion in shoulder. Had cervical fusion in over 20 years ago maybe related has been in therapy with no relief    HPI: 67 year old female history of cervical fusion 20 years ago comes in with what initially was thought to be shoulder pain which she thinks is her neck  Her pain is in the upper left trapezius muscle and radiates into her arm with occasional pain and numbness and tingling in the left hand.  She was treated with physical therapy for the shoulders but did not improve and presents now for further evaluation and management denying any weakness or numbness or tingling except for the occasional radiation of the pain down into the hand.              ROS: Negative at this time   Images personally read and my interpretation : Images done in the office  Visit Diagnoses:  1. Neck pain      Follow-Up Instructions: Return in about 6 weeks (around 10/12/2023) for FOLLOW UP, NECK.    Objective: Vital Signs: BP 139/85   Pulse 99   Ht 5\' 6"  (1.676 m)   Wt 157 lb 12.8 oz (71.6 kg)   BMI 25.47 kg/m   Physical Exam Vitals and nursing note reviewed.  Constitutional:      Appearance: Normal appearance.  HENT:     Head: Normocephalic and atraumatic.  Eyes:     General: No scleral icterus.        Right eye: No discharge.        Left eye: No discharge.     Extraocular Movements: Extraocular movements intact.     Conjunctiva/sclera: Conjunctivae normal.     Pupils: Pupils are equal, round, and reactive to light.  Cardiovascular:     Rate and Rhythm: Normal rate.     Pulses: Normal pulses.  Skin:    General: Skin is warm and dry.     Capillary Refill: Capillary refill takes less than 2 seconds.  Neurological:     General: No focal deficit present.     Mental Status: She is alert and oriented to person, place, and time.  Psychiatric:        Mood and Affect: Mood normal.        Behavior: Behavior normal.        Thought Content: Thought content normal.        Judgment: Judgment normal.      Ortho Exam  C-spine shows some decrease in range of motion there is an anterior cervical scar consistent with anterior cervical fusionon x-ray.  She has increased pain when she turns to the left she has  tenderness in her traps and mild tenderness in the midline of the cervical spine she has full range of motion of her left shoulder with no weakness in the upper extremity.  Specialty Comments:  No specialty comments available.  Imaging: DG Cervical Spine 2 or 3 views  Result Date: 08/31/2023 Left arm pain and shoulder and neck pain X-ray shows a C5-6-7 fusion with plate and screws which are intact the level above it has a large anterior osteophyte and at C3-4 we see disc space narrowing Impression cervical spondylosis stable fusion with degeneration of the segments above the fusion     PMFS History: Patient Active Problem List   Diagnosis Date Noted   S/P hysterectomy 06/29/2022   Vaginal dryness 06/29/2022   Dyspareunia, female 06/29/2022   Chronic GERD    Gastric erosion    Erosive esophagitis    Positive fecal occult blood test    Polyp of ascending colon    Adenomatous polyp of descending colon    Anxiety disorder 10/26/2021   Current tear of lateral cartilage or meniscus of  knee 10/26/2021   Osteoarthritis of left hip 10/26/2021   CKD (chronic kidney disease) stage 3, GFR 30-59 ml/min (HCC) 10/26/2021   Essential hypertension 10/26/2021   History of total hip arthroplasty 05/26/2021   History of revision of total replacement of left hip joint 04/27/2021   Lesion of larynx 01/10/2020   Adjustment disorder with mixed disturbance of emotions and conduct 06/09/2016   Pain disorder 06/09/2016   History of colonic polyps    Past Medical History:  Diagnosis Date   Anxiety    Arthritis    Back pain    Depression    GERD (gastroesophageal reflux disease)    Hypertension    Polycystic disease, ovaries    Vitamin D deficiency     Family History  Problem Relation Age of Onset   Cancer Father    Stroke Mother    Kidney disease Mother    Aneurysm Brother    Aneurysm Brother        stomach   Cancer Sister        blood   Breast cancer Neg Hx     Past Surgical History:  Procedure Laterality Date   ABDOMINAL HYSTERECTOMY     BACK SURGERY     cervical fusion   carpel tunnel     COLONOSCOPY WITH PROPOFOL N/A 04/02/2016   Procedure: COLONOSCOPY WITH PROPOFOL;  Surgeon: Christena Deem, MD;  Location: [facility] ENDOSCOPY;  Service: Endoscopy;  Laterality: N/A;   COLONOSCOPY WITH PROPOFOL N/A 11/03/2021   Procedure: COLONOSCOPY WITH PROPOFOL;  Surgeon: Toney Reil, MD;  Location: [facility] ENDOSCOPY;  Service: Gastroenterology;  Laterality: N/A;   COLOSTOMY REVISION Right 05/06/2016   Procedure: COLON RESECTION RIGHT;  Surgeon: Lattie Haw, MD;  Location: [facility] ORS;  Service: General;  Laterality: Right;   ESOPHAGOGASTRODUODENOSCOPY (EGD) WITH PROPOFOL N/A 11/03/2021   Procedure: ESOPHAGOGASTRODUODENOSCOPY (EGD) WITH PROPOFOL;  Surgeon: Toney Reil, MD;  Location: [facility] ENDOSCOPY;  Service: Gastroenterology;  Laterality: N/A;   PARTIAL HYSTERECTOMY     TOTAL HIP ARTHROPLASTY Left 05/14/2021   TUBAL LIGATION     Social History   Occupational  History   Not on file  Tobacco Use   Smoking status: Every Day    Current packs/day: 0.25    Average packs/day: 0.3 packs/day for 30.0 years (7.5 ttl pk-yrs)    Types: Cigarettes   Smokeless tobacco: Never  Vaping Use  Vaping status: Never Used  Substance and Sexual Activity   Alcohol use: No   Drug use: No   Sexual activity: Yes    Birth control/protection: Surgical    Comment: hyst

## 2023-09-26 ENCOUNTER — Encounter: Payer: Self-pay | Admitting: Psychiatry

## 2023-09-26 ENCOUNTER — Other Ambulatory Visit
Admission: RE | Admit: 2023-09-26 | Discharge: 2023-09-26 | Disposition: A | Discharge status: Home / Self Care | Payer: Medicare HMO | Source: Ambulatory Visit | Attending: Psychiatry | Admitting: Psychiatry

## 2023-09-26 ENCOUNTER — Other Ambulatory Visit: Payer: Self-pay | Admitting: Psychiatry

## 2023-09-26 ENCOUNTER — Telehealth: Payer: Self-pay

## 2023-09-26 ENCOUNTER — Ambulatory Visit (INDEPENDENT_AMBULATORY_CARE_PROVIDER_SITE_OTHER): Payer: Medicare HMO | Admitting: Psychiatry

## 2023-09-26 VITALS — BP 128/86 | HR 100 | Temp 98.1°F | Ht 66.0 in | Wt 154.0 lb

## 2023-09-26 DIAGNOSIS — F330 Major depressive disorder, recurrent, mild: Secondary | ICD-10-CM | POA: Insufficient documentation

## 2023-09-26 DIAGNOSIS — F4310 Post-traumatic stress disorder, unspecified: Secondary | ICD-10-CM

## 2023-09-26 LAB — TSH: TSH: 1.556 u[IU]/mL (ref 0.350–4.500)

## 2023-09-26 MED ORDER — FLUOXETINE HCL 10 MG PO CAPS: 10.0000 mg | ORAL_CAPSULE | Freq: Every day | ORAL | 1 refills | Status: AC
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
## 2023-09-26 NOTE — Telephone Encounter (Signed)
Pt was notified.  

## 2023-09-26 NOTE — Patient Instructions (Addendum)
Start fluoxetine 10 mg daily  Continue lorazepam 1 mg twice a day as needed for anxiety  Obtain TSH at [facility] Next appointment: 2/10 10:30

## 2023-09-26 NOTE — Telephone Encounter (Signed)
Order is sent.  

## 2023-09-26 NOTE — Telephone Encounter (Signed)
pt called states that she needed the fluoxetine sent to the [facility]

## 2023-10-10 ENCOUNTER — Telehealth: Payer: Self-pay

## 2023-10-10 NOTE — Telephone Encounter (Signed)
 pt wanted to know if you was able to see the labwork results and if she needed any other labs done.

## 2023-10-10 NOTE — Telephone Encounter (Signed)
 The comment was noted in my chart. I reviewed the result, and TSH is within the normal range, and no additional lab work is necessary at this time.

## 2023-10-11 NOTE — Telephone Encounter (Signed)
 Left message to call office back

## 2023-10-11 NOTE — Telephone Encounter (Signed)
 Pt.notified

## 2023-10-12 NOTE — Progress Notes (Signed)
   Blood Pressure 131/81   Pulse 98   Height 5\' 6"  (1.676 m)   Weight 157 lb (71.2 kg)   Body Mass Index 25.34 kg/m   Body mass index is 25.34 kg/m.  Chief Complaint  Patient presents with   Follow-up    Recheck on neck pain     No diagnosis found.  DOI/DOS/   Unchanged

## 2023-10-14 ENCOUNTER — Ambulatory Visit: Payer: Medicare HMO | Admitting: Orthopedic Surgery

## 2023-10-14 ENCOUNTER — Telehealth: Payer: Self-pay | Admitting: Gastroenterology

## 2023-10-14 VITALS — BP 131/81 | HR 98 | Ht 66.0 in | Wt 157.0 lb

## 2023-10-14 DIAGNOSIS — M542 Cervicalgia: Secondary | ICD-10-CM

## 2023-10-14 NOTE — Telephone Encounter (Signed)
The patient called in to reschedule her office visit.

## 2023-10-14 NOTE — Progress Notes (Signed)
  Subjective:     Patient ID: Paula Spencer, female   DOB: 1956/09/26, 68 y.o.   MRN: [id number]  68 year old female with cervical spasms   Recommend meloxicam and cyclobenzaprine and heating pad as she does not want to take any medication   Recheck 6 weeks      Review of Systems     Objective:   Physical Exam Vitals and nursing note reviewed.  Constitutional:      Appearance: Normal appearance.  HENT:     Head: Normocephalic and atraumatic.  Eyes:     General: No scleral icterus.       Right eye: No discharge.        Left eye: No discharge.     Extraocular Movements: Extraocular movements intact.     Conjunctiva/sclera: Conjunctivae normal.     Pupils: Pupils are equal, round, and reactive to light.  Cardiovascular:     Rate and Rhythm: Normal rate.     Pulses: Normal pulses.  Musculoskeletal:     Comments: Improved neck and shoulder ROM   Skin:    General: Skin is warm and dry.     Capillary Refill: Capillary refill takes less than 2 seconds.  Neurological:     General: No focal deficit present.     Mental Status: She is alert and oriented to person, place, and time.  Psychiatric:        Mood and Affect: Mood normal.        Behavior: Behavior normal.        Thought Content: Thought content normal.        Judgment: Judgment normal.        Assessment:     STABLE at this time doing well with cyclobenzoprine    Plan:     Return as needed , continue cyclobenzoprine

## 2023-10-17 ENCOUNTER — Ambulatory Visit: Payer: Self-pay | Admitting: Psychiatry

## 2023-10-18 ENCOUNTER — Other Ambulatory Visit: Payer: Self-pay

## 2023-10-18 ENCOUNTER — Encounter: Payer: Self-pay | Admitting: Physician Assistant

## 2023-10-18 ENCOUNTER — Ambulatory Visit (INDEPENDENT_AMBULATORY_CARE_PROVIDER_SITE_OTHER): Payer: Medicare HMO | Admitting: Physician Assistant

## 2023-10-18 VITALS — BP 140/74 | HR 99 | Temp 98.7°F | Ht 66.0 in | Wt 154.8 lb

## 2023-10-18 DIAGNOSIS — R1013 Epigastric pain: Secondary | ICD-10-CM | POA: Diagnosis not present

## 2023-10-18 DIAGNOSIS — R630 Anorexia: Secondary | ICD-10-CM

## 2023-10-18 DIAGNOSIS — K2100 Gastro-esophageal reflux disease with esophagitis, without bleeding: Secondary | ICD-10-CM | POA: Diagnosis not present

## 2023-10-18 DIAGNOSIS — R634 Abnormal weight loss: Secondary | ICD-10-CM

## 2023-10-18 DIAGNOSIS — R1011 Right upper quadrant pain: Secondary | ICD-10-CM | POA: Diagnosis not present

## 2023-10-18 DIAGNOSIS — R1084 Generalized abdominal pain: Secondary | ICD-10-CM

## 2023-10-18 DIAGNOSIS — K2970 Gastritis, unspecified, without bleeding: Secondary | ICD-10-CM

## 2023-10-18 DIAGNOSIS — Z860101 Personal history of adenomatous and serrated colon polyps: Secondary | ICD-10-CM

## 2023-10-18 MED ORDER — OMEPRAZOLE 40 MG PO CPDR: 40.0000 mg | DELAYED_RELEASE_CAPSULE | Freq: Every day | ORAL | 3 refills | Status: DC

## 2023-10-18 NOTE — Progress Notes (Signed)
Celso Amy, PA-C [address]  [address]  [place], [place] [address]  Main: [phone]  Fax: [phone]   Primary Care Physician: Leanna Sato, MD  Primary Gastroenterologist:  Celso Amy, PA-C / Dr. Lannette Donath    CC: F/U GERD; Increasing Abdominal Pain  HPI: Paula Spencer is a 68 y.o. female returns returns for 3 month f/u of GERD and discuss increasing abdominal pain.    She is currently taking omeprazole 20 Mg daily for chronic GERD.  This is not controlling her acid reflux.  She also started OTC Pepcid with mild benefit.  She is having Epigastric, RUQ, and generalized peri-umbilical abdominal pain which is worse after eating.  She wants to increase omeprazole back up to 40mg  daily which worked better.  She also admits to weight loss, decreased appetite, stress, and anxiety.  Current smoker, trying to quit.  Saw ENT and laryngoscopy was unrevealing.  Admits to tobacco use (cigarettes) and occasional wine.  Denies NSAIDs.     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   Last EGD 10/2021 by Dr. Allegra Lai showed erosive gastropathy, LA grade A esophagitis, normal duodenum.  Biopsies negative for H. pylori.  She was treated with Prilosec 40 Mg twice daily for 3 months.   Last colonoscopy 10/2021 showed 2 diminutive polyps removed from the ascending colon, one 4 mm polyp removed from descending colon, and a 15 mm polyp removed from descending colon.  Pathology showed sessile serrated polyp and 3 tubular adenomas.  No dysplasia.  Good prep.  3-year repeat (due 10/2024).  Current Outpatient Medications  Medication Sig Dispense Refill   acetaminophen (TYLENOL) 500 MG tablet Take by mouth.     alendronate (FOSAMAX) 70 MG tablet Take 70 mg by mouth once a week.     amLODipine (NORVASC) 10 MG tablet Take by mouth.     CALCIUM PO Take by mouth. With Vit D3-takes BID     cyclobenzaprine (FLEXERIL) 5 MG tablet  Take 5 mg by mouth 3 (three) times daily as needed.     FLUoxetine (PROZAC) 10 MG capsule Take 1 capsule (10 mg total) by mouth daily. 30 capsule 1   LORazepam (ATIVAN) 1 MG tablet Take 1 mg by mouth as needed for anxiety.     MAGNESIUM PO Take 400 mg by mouth. Takes BID     omeprazole (PRILOSEC) 40 MG capsule Take 1 capsule (40 mg total) by mouth daily. 90 capsule 3   Triamcinolone Acetonide (NASACORT AQ NA) Place into the nose.     zolpidem (AMBIEN) 10 MG tablet Take 10 mg by mouth at bedtime as needed.     No current facility-administered medications for this visit.    Allergies as of 10/18/2023 - Review Complete 10/18/2023  Allergen Reaction Noted   Diclofenac Other (See Comments) 05/14/2021   Nsaids Other (See Comments) 04/01/2016   Skelaxin [metaxalone] Other (See Comments) 04/01/2016    Past Medical History:  Diagnosis Date   Anxiety    Arthritis    Back pain    Depression    GERD (gastroesophageal reflux disease)    Hypertension    Polycystic disease, ovaries    Vitamin D deficiency     Past Surgical History:  Procedure Laterality Date   ABDOMINAL HYSTERECTOMY     BACK SURGERY     cervical fusion   carpel tunnel     COLONOSCOPY WITH PROPOFOL N/A 04/02/2016  Procedure: COLONOSCOPY WITH PROPOFOL;  Surgeon: Christena Deem, MD;  Location: [facility] ENDOSCOPY;  Service: Endoscopy;  Laterality: N/A;   COLONOSCOPY WITH PROPOFOL N/A 11/03/2021   Procedure: COLONOSCOPY WITH PROPOFOL;  Surgeon: Toney Reil, MD;  Location: [facility] ENDOSCOPY;  Service: Gastroenterology;  Laterality: N/A;   COLOSTOMY REVISION Right 05/06/2016   Procedure: COLON RESECTION RIGHT;  Surgeon: Lattie Haw, MD;  Location: [facility] ORS;  Service: General;  Laterality: Right;   ESOPHAGOGASTRODUODENOSCOPY (EGD) WITH PROPOFOL N/A 11/03/2021   Procedure: ESOPHAGOGASTRODUODENOSCOPY (EGD) WITH PROPOFOL;  Surgeon: Toney Reil, MD;  Location: [facility] ENDOSCOPY;  Service: Gastroenterology;  Laterality:  N/A;   PARTIAL HYSTERECTOMY     TOTAL HIP ARTHROPLASTY Left 05/14/2021   TUBAL LIGATION      Review of Systems:    All systems reviewed and negative except where noted in HPI.   Physical Examination:   BP (!) 140/74   Pulse 99   Temp 98.7 F (37.1 C)   Ht 5\' 6"  (1.676 m)   Wt 154 lb 12.8 oz (70.2 kg)   BMI 24.99 kg/m   General: Well-nourished, well-developed in no acute distress.  Lungs: Clear to auscultation bilaterally. Non-labored. Heart: Regular rate and rhythm, no murmurs rubs or gallops.  Abdomen: Bowel sounds are normal; Abdomen is Soft; No hepatosplenomegaly, masses or hernias;  No Abdominal Tenderness; No guarding or rebound tenderness. Neuro: Alert and oriented x 3.  Grossly intact.  Psych: Alert and cooperative, normal mood and affect.   Imaging Studies: No results found.  Assessment and Plan:   Paula Spencer is a 68 y.o. y/o female returns for follow-up chronic GERD with esophagitis, erosive gastritis, and history of adenomatous polyps.  She is having increased abdominal pain on low dose PPI.  Also admits to weight loss and decreased appetite.   1.  Chronic GERD with esophagitis (LA Grade A)             Increase Rx omeprazole to 40 Mg once daily.             Add OTC Pepcid 20mg  BID if needed.   2.  Erosive gastritis; biopsies negative for H. Pylori             Increase omeprazole to 40 Mg once daily.             Avoid NSAIDs  3.  Epigastric, RUQ, and Generalized Abdominal Pain  Lab: CBC, CMP  CT Abd / Pelvis with Contrast  4.  Weight Loss, Decreased appetite  CT Abd / Pelvis with Contrast   5.  History of adenomatous colon polyps.  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  Last colonoscopy 10/2021 showed multiple adenomatous polyps with no dysplasia.             3-year repeat colonoscopy will be due 10/2024.    Celso Amy, PA-C  Follow up Based on above test results and GI  symptoms.

## 2023-10-18 NOTE — Patient Instructions (Addendum)
CT scan scheduled @ 10/25/23 @ 10:30 am.  Outpatient Imaging  [address] [place] [address]

## 2023-10-19 ENCOUNTER — Encounter: Payer: Self-pay | Admitting: Physician Assistant

## 2023-10-19 LAB — COMPREHENSIVE METABOLIC PANEL
ALT: 12 [IU]/L (ref 0–32)
AST: 20 [IU]/L (ref 0–40)
Albumin: 4.7 g/dL (ref 3.9–4.9)
Alkaline Phosphatase: 52 [IU]/L (ref 44–121)
BUN/Creatinine Ratio: 11 — ABNORMAL LOW (ref 12–28)
BUN: 13 mg/dL (ref 8–27)
Bilirubin Total: 0.4 mg/dL (ref 0.0–1.2)
CO2: 22 mmol/L (ref 20–29)
Calcium: 10 mg/dL (ref 8.7–10.3)
Chloride: 104 mmol/L (ref 96–106)
Creatinine, Ser: 1.19 mg/dL — ABNORMAL HIGH (ref 0.57–1.00)
Globulin, Total: 2.8 g/dL (ref 1.5–4.5)
Glucose: 91 mg/dL (ref 70–99)
Potassium: 4 mmol/L (ref 3.5–5.2)
Sodium: 142 mmol/L (ref 134–144)
Total Protein: 7.5 g/dL (ref 6.0–8.5)
eGFR: 50 mL/min/{1.73_m2} — ABNORMAL LOW (ref >59)

## 2023-10-19 LAB — CBC WITH DIFFERENTIAL/PLATELET
Basophils Absolute: 0 10*3/uL (ref 0.0–0.2)
Basos: 0 %
EOS (ABSOLUTE): 0.1 10*3/uL (ref 0.0–0.4)
Eos: 1 %
Hematocrit: 48 % — ABNORMAL HIGH (ref 34.0–46.6)
Hemoglobin: 15.5 g/dL (ref 11.1–15.9)
Immature Grans (Abs): 0 10*3/uL (ref 0.0–0.1)
Immature Granulocytes: 0 %
Lymphocytes Absolute: 3.7 10*3/uL — ABNORMAL HIGH (ref 0.7–3.1)
Lymphs: 42 %
MCH: 29.8 pg (ref 26.6–33.0)
MCHC: 32.3 g/dL (ref 31.5–35.7)
MCV: 92 fL (ref 79–97)
Monocytes Absolute: 0.5 10*3/uL (ref 0.1–0.9)
Monocytes: 6 %
Neutrophils Absolute: 4.4 10*3/uL (ref 1.4–7.0)
Neutrophils: 51 %
Platelets: 307 10*3/uL (ref 150–450)
RBC: 5.21 x10E6/uL (ref 3.77–5.28)
RDW: 13.2 % (ref 11.7–15.4)
WBC: 8.7 10*3/uL (ref 3.4–10.8)

## 2023-10-25 ENCOUNTER — Ambulatory Visit
Admission: RE | Admit: 2023-10-25 | Discharge: 2023-10-25 | Disposition: A | Discharge status: Home / Self Care | Payer: Medicare HMO | Source: Ambulatory Visit | Attending: Physician Assistant | Admitting: Physician Assistant

## 2023-10-25 DIAGNOSIS — R1013 Epigastric pain: Secondary | ICD-10-CM | POA: Insufficient documentation

## 2023-10-25 MED ORDER — IOHEXOL 300 MG/ML  SOLN
80.0000 mL | Freq: Once | INTRAMUSCULAR | Status: AC | PRN
  Administered 2023-10-25: 80 mL via INTRAVENOUS

## 2023-10-31 ENCOUNTER — Telehealth: Payer: Self-pay | Admitting: Psychiatry

## 2023-10-31 NOTE — Telephone Encounter (Signed)
PT called on 10/31/23 to cancel her appt with Dr. Vanetta Shawl that was scheduled for 11/07/23 due to now rceiving services at the [place]. PT said she would reach back out if she ever needed to.

## 2023-11-02 ENCOUNTER — Telehealth: Payer: Self-pay

## 2023-11-02 ENCOUNTER — Telehealth: Payer: Self-pay | Admitting: Physician Assistant

## 2023-11-02 NOTE — Telephone Encounter (Signed)
 Patient called wanting results of CT scan done last week- I called her to let her know I have placed a call for the radiologist to read  and that I would call her as soon as we get the results.

## 2023-11-02 NOTE — Telephone Encounter (Signed)
 The patient called in to get her results.

## 2023-11-03 ENCOUNTER — Encounter: Payer: Self-pay | Admitting: Physician Assistant

## 2023-11-07 ENCOUNTER — Ambulatory Visit: Payer: Self-pay | Admitting: Psychiatry

## 2023-11-15 ENCOUNTER — Ambulatory Visit: Payer: Medicare HMO | Admitting: Gastroenterology

## 2023-11-22 ENCOUNTER — Ambulatory Visit: Payer: Medicare HMO | Admitting: Physician Assistant

## 2023-12-06 ENCOUNTER — Telehealth: Payer: Self-pay | Admitting: Physician Assistant

## 2023-12-06 ENCOUNTER — Ambulatory Visit (HOSPITAL_COMMUNITY): Payer: Medicare HMO | Admitting: Psychiatry

## 2023-12-06 NOTE — Telephone Encounter (Signed)
 The patient called and left a voicemail regarding a message in her MyChart stating that Celso Amy, PA-C, would be leaving. The patient inquired about other available providers. I returned her call to confirm that we received her message and informed her that the current providers are Dr. Tobi Bastos and Dr. Allegra Lai.

## 2024-01-18 ENCOUNTER — Other Ambulatory Visit: Payer: Self-pay | Admitting: Nurse Practitioner

## 2024-01-18 DIAGNOSIS — Z122 Encounter for screening for malignant neoplasm of respiratory organs: Secondary | ICD-10-CM

## 2024-01-27 ENCOUNTER — Ambulatory Visit
Admission: RE | Admit: 2024-01-27 | Discharge: 2024-01-27 | Disposition: A | Discharge status: Home / Self Care | Source: Ambulatory Visit | Attending: Nurse Practitioner | Admitting: Nurse Practitioner

## 2024-01-27 DIAGNOSIS — Z122 Encounter for screening for malignant neoplasm of respiratory organs: Secondary | ICD-10-CM | POA: Insufficient documentation

## 2024-04-10 ENCOUNTER — Encounter: Payer: Self-pay | Admitting: Family Medicine

## 2024-04-20 ENCOUNTER — Encounter: Payer: Self-pay | Admitting: Family Medicine

## 2024-04-20 DIAGNOSIS — I700 Atherosclerosis of aorta: Secondary | ICD-10-CM

## 2024-04-23 ENCOUNTER — Encounter: Payer: Self-pay | Admitting: Family Medicine

## 2024-04-26 ENCOUNTER — Other Ambulatory Visit: Payer: Self-pay | Admitting: Family Medicine

## 2024-04-26 ENCOUNTER — Other Ambulatory Visit (HOSPITAL_COMMUNITY): Payer: Self-pay | Admitting: Family Medicine

## 2024-04-26 DIAGNOSIS — Z1231 Encounter for screening mammogram for malignant neoplasm of breast: Secondary | ICD-10-CM

## 2024-04-26 DIAGNOSIS — I700 Atherosclerosis of aorta: Secondary | ICD-10-CM

## 2024-05-03 ENCOUNTER — Ambulatory Visit
Admission: RE | Admit: 2024-05-03 | Discharge: 2024-05-03 | Disposition: A | Discharge status: Home / Self Care | Payer: Self-pay | Source: Ambulatory Visit | Attending: Family Medicine | Admitting: Family Medicine

## 2024-05-03 DIAGNOSIS — I700 Atherosclerosis of aorta: Secondary | ICD-10-CM | POA: Insufficient documentation

## 2024-05-24 ENCOUNTER — Ambulatory Visit
Admission: RE | Admit: 2024-05-24 | Discharge: 2024-05-24 | Disposition: A | Discharge status: Home / Self Care | Source: Ambulatory Visit | Attending: Family Medicine | Admitting: Family Medicine

## 2024-05-24 DIAGNOSIS — Z1231 Encounter for screening mammogram for malignant neoplasm of breast: Secondary | ICD-10-CM | POA: Diagnosis present

## 2024-07-31 ENCOUNTER — Emergency Department (HOSPITAL_COMMUNITY)

## 2024-07-31 ENCOUNTER — Emergency Department (HOSPITAL_COMMUNITY)
Admission: EM | Admit: 2024-07-31 | Discharge: 2024-07-31 | Disposition: A | Discharge status: Home / Self Care | Attending: Emergency Medicine | Admitting: Emergency Medicine

## 2024-07-31 DIAGNOSIS — K439 Ventral hernia without obstruction or gangrene: Secondary | ICD-10-CM | POA: Insufficient documentation

## 2024-07-31 DIAGNOSIS — R1013 Epigastric pain: Secondary | ICD-10-CM | POA: Diagnosis present

## 2024-07-31 DIAGNOSIS — N189 Chronic kidney disease, unspecified: Secondary | ICD-10-CM | POA: Diagnosis not present

## 2024-07-31 DIAGNOSIS — Z79899 Other long term (current) drug therapy: Secondary | ICD-10-CM | POA: Diagnosis not present

## 2024-07-31 DIAGNOSIS — I700 Atherosclerosis of aorta: Secondary | ICD-10-CM | POA: Insufficient documentation

## 2024-07-31 DIAGNOSIS — I129 Hypertensive chronic kidney disease with stage 1 through stage 4 chronic kidney disease, or unspecified chronic kidney disease: Secondary | ICD-10-CM | POA: Insufficient documentation

## 2024-07-31 LAB — CBC WITH DIFFERENTIAL/PLATELET
Abs Immature Granulocytes: 0.02 K/uL (ref 0.00–0.07)
Basophils Absolute: 0 K/uL (ref 0.0–0.1)
Basophils Relative: 0 %
Eosinophils Absolute: 0.1 K/uL (ref 0.0–0.5)
Eosinophils Relative: 1 %
HCT: 45.2 % (ref 36.0–46.0)
Hemoglobin: 14.9 g/dL (ref 12.0–15.0)
Immature Granulocytes: 0 %
Lymphocytes Relative: 24 %
Lymphs Abs: 1.8 K/uL (ref 0.7–4.0)
MCH: 30.2 pg (ref 26.0–34.0)
MCHC: 33 g/dL (ref 30.0–36.0)
MCV: 91.5 fL (ref 80.0–100.0)
Monocytes Absolute: 0.5 K/uL (ref 0.1–1.0)
Monocytes Relative: 6 %
Neutro Abs: 5.2 K/uL (ref 1.7–7.7)
Neutrophils Relative %: 69 %
Platelets: 250 K/uL (ref 150–400)
RBC: 4.94 MIL/uL (ref 3.87–5.11)
RDW: 13.4 % (ref 11.5–15.5)
WBC: 7.5 K/uL (ref 4.0–10.5)
nRBC: 0 % (ref 0.0–0.2)

## 2024-07-31 LAB — TROPONIN T, HIGH SENSITIVITY: Troponin T High Sensitivity: <15 ng/L (ref 0–19)

## 2024-07-31 LAB — COMPREHENSIVE METABOLIC PANEL WITH GFR
ALT: 8 U/L (ref 0–44)
AST: 19 U/L (ref 15–41)
Albumin: 4.3 g/dL (ref 3.5–5.0)
Alkaline Phosphatase: 44 U/L (ref 38–126)
Anion gap: 10 (ref 5–15)
BUN: 9 mg/dL (ref 8–23)
CO2: 25 mmol/L (ref 22–32)
Calcium: 9.1 mg/dL (ref 8.9–10.3)
Chloride: 106 mmol/L (ref 98–111)
Creatinine, Ser: 1.18 mg/dL — ABNORMAL HIGH (ref 0.44–1.00)
GFR, Estimated: 50 mL/min — ABNORMAL LOW (ref >60)
Glucose, Bld: 99 mg/dL (ref 70–99)
Potassium: 4.2 mmol/L (ref 3.5–5.1)
Sodium: 140 mmol/L (ref 135–145)
Total Bilirubin: 0.6 mg/dL (ref 0.0–1.2)
Total Protein: 7 g/dL (ref 6.5–8.1)

## 2024-07-31 LAB — LIPASE, BLOOD: Lipase: 13 U/L (ref 11–51)

## 2024-07-31 MED ORDER — PANTOPRAZOLE SODIUM 40 MG IV SOLR
40.0000 mg | Freq: Once | INTRAVENOUS | Status: AC
Start: 2024-07-31 — End: 2024-07-31
  Administered 2024-07-31: 40 mg via INTRAVENOUS
  Filled 2024-07-31: qty 10

## 2024-07-31 MED ORDER — ALUM & MAG HYDROXIDE-SIMETH 200-200-20 MG/5ML PO SUSP
30.0000 mL | Freq: Once | ORAL | Status: AC
  Administered 2024-07-31: 30 mL via ORAL
  Filled 2024-07-31: qty 30

## 2024-07-31 MED ORDER — PANTOPRAZOLE SODIUM 40 MG PO TBEC: 40.0000 mg | DELAYED_RELEASE_TABLET | Freq: Every day | ORAL | 0 refills | Status: DC

## 2024-07-31 MED ORDER — IOHEXOL 300 MG/ML  SOLN
100.0000 mL | Freq: Once | INTRAMUSCULAR | Status: AC | PRN
  Administered 2024-07-31: 100 mL via INTRAVENOUS

## 2024-07-31 NOTE — ED Provider Notes (Cosign Needed Addendum)
 [place] EMERGENCY DEPARTMENT AT [facility] Provider Note   CSN: [id number] Arrival date & time: 07/31/24  9190     Patient presents with: Nausea   Paula Spencer is a 68 y.o. female.  History of prediabetes, CKD, high cholesterol, GERD and hypertension.  She presents to the ER today for evaluation of epigastric pain that started 2 days ago and has been constant, but was worse this morning.  She states it feels like her acid reflux, but she was worried because the pain kept her awake all night.  When she got up this morning she tried eating applesauce and some crackers and states this made her symptoms worse, she took her omeprazole  and famotidine  which gave her mild temporary relief but then the pain returned, denies chest pain or shortness of breath, sweating, vomiting.  Pain is not radiating.   HPI     Prior to Admission medications   Medication Sig Start Date End Date Taking? Authorizing Provider  acetaminophen  (TYLENOL ) 500 MG tablet Take by mouth.    [provider]  alendronate (FOSAMAX) 70 MG tablet Take 70 mg by mouth once a week. 08/18/23   [provider]  amLODipine  (NORVASC ) 10 MG tablet Take by mouth. 08/28/14   [provider]  CALCIUM PO Take by mouth. With Vit D3-takes BID    [provider]  cyclobenzaprine  (FLEXERIL ) 5 MG tablet Take 5 mg by mouth 3 (three) times daily as needed. 09/01/23   [provider]  FLUoxetine  (PROZAC ) 10 MG capsule Take 1 capsule (10 mg total) by mouth daily. 09/26/23 11/25/23  Vickey Mettle, MD  LORazepam  (ATIVAN ) 1 MG tablet Take 1 mg by mouth as needed for anxiety.    [provider]  MAGNESIUM PO Take 400 mg by mouth. Takes BID    [provider]  omeprazole  (PRILOSEC) 40 MG capsule Take 1 capsule (40 mg total) by mouth daily. 10/18/23 10/12/24  [place], PA-C  Triamcinolone Acetonide (NASACORT AQ NA) Place into the nose.    [provider]  zolpidem  (AMBIEN) 10 MG tablet Take 10 mg by mouth at bedtime as needed. 08/02/23   [provider]    Allergies: Diclofenac, Nsaids, and Skelaxin [metaxalone]    Review of Systems  Updated Vital Signs BP 126/71 (BP Location: Left Arm)   Pulse 82   Temp 98.4 F (36.9 C) (Oral)   Resp 19   SpO2 99%   Physical Exam Vitals and nursing note reviewed.  Constitutional:      General: She is not in acute distress.    Appearance: She is well-developed.  HENT:     Head: Normocephalic and atraumatic.     Mouth/Throat:     Mouth: Mucous membranes are moist.  Eyes:     Extraocular Movements: Extraocular movements intact.     Conjunctiva/sclera: Conjunctivae normal.     Pupils: Pupils are equal, round, and reactive to light.  Cardiovascular:     Rate and Rhythm: Normal rate and regular rhythm.     Heart sounds: No murmur heard. Pulmonary:     Effort: Pulmonary effort is normal. No respiratory distress.     Breath sounds: Normal breath sounds.  Abdominal:     Palpations: Abdomen is soft.     Tenderness: There is abdominal tenderness in the right upper quadrant, epigastric area and left upper quadrant. There is no guarding or rebound.  Musculoskeletal:        General: No swelling.  Cervical back: Neck supple.  Skin:    General: Skin is warm and dry.     Capillary Refill: Capillary refill takes less than 2 seconds.  Neurological:     General: No focal deficit present.     Mental Status: She is alert and oriented to person, place, and time.  Psychiatric:        Mood and Affect: Mood normal.     (all labs ordered are listed, but only abnormal results are displayed) Labs Reviewed  CBC WITH DIFFERENTIAL/PLATELET  COMPREHENSIVE METABOLIC PANEL WITH GFR  LIPASE, BLOOD  URINALYSIS, ROUTINE W REFLEX MICROSCOPIC  TROPONIN T, HIGH SENSITIVITY    EKG: None  Radiology: No results found.   Procedures   Medications Ordered in the ED - No data to display                                   Medical Decision Making This patient presents to the ED for concern of abdominal pain and nausea, this involves an extensive number of treatment options, and is a complaint that carries with it a high risk of complications and morbidity.  The differential diagnosis includes ACS, GERD, pancreatitis, cholecystitis, esophagitis, gastritis, other   Co morbidities that complicate the patient evaluation  GERD, hypertension   Additional history obtained:  Additional history obtained from EMR External records from outside source obtained and reviewed including ER notes and labs   Lab Tests:  I Ordered, and personally interpreted labs.  The pertinent results include: CBC normal, CMP with baseline kidney function, lipase normal, troponin negative  EKG shows normal sinus rhythm  Imaging Studies ordered:  I ordered imaging studies including CT abdomen pelvis I independently visualized and interpreted imaging which showed ventral hernia and small umbilical hernia without obstruction, aortic atherosclerosis I agree with the radiologist interpretation     Problem List / ED Course / Critical interventions / Medication management  Epigastric pain-worse after eating, patient states it feels like.,  Given her age cardiac workup was also ordered which was negative, pain has been constant for the past several days, do not think she needs further evaluation for cardiac cause at this time.  She is having some epigastric tenderness so CT abdomen pelvis was also ordered, shows a ventral hernia, this area has minimal tenderness and patient states it is the same as it always, CT reports this area looks similar to prior exam.  Patient advised on surgery follow-up as needed.  Advised on PCP follow-up for incidental finding of aortic atherosclerosis.  She is feeling better after Protonix and will prescribe Protonix for home and advised to stop her Meprazole.  Given GI follow-up as well.  Patient was  not having urinary symptoms, no fever, no flank pain, no leukocytosis or fever, I do not think she needs a UA. I ordered medication as above Reevaluation of the patient after these medicines showed that the patient improved I have reviewed the patients home medicines and have made adjustments as needed      Amount and/or Complexity of Data Reviewed Labs: ordered. Radiology: ordered.    Details: Chest x-ray shows no pulmonary edema or infiltrate  Risk OTC drugs. Prescription drug management.        Final diagnoses:  None    ED Discharge Orders     None          Suellen Sherran LABOR, PA-C 07/31/24 1331  Suellen Sherran LABOR, PA-C 07/31/24 1332    Freddi Hamilton, MD 08/02/24 [phone]

## 2024-07-31 NOTE — Discharge Instructions (Addendum)
 You are seen in the ER today for abdominal pain that felt like acid reflux.  Your evaluation was overall very reassuring, we did not find any signs of an emergent condition such as a heart attack, bowel obstruction, or infection.  You are feeling better after some antacid medication.  If you do push fluids, rest, avoid spicy or greasy foods, carbonated drinks.  Stop the omeprazole  and start the Protonix.  You can follow-up with gastroenterology.  Your CT scan did show some incidental findings, you have a hernia that was present on your prior imaging.  As you did this area is often sore you can follow-up with general surgery to see if repair is indicated if it continues to bother you, if you get bulging or discoloration of this area or severe pain here you should come back to the ER right away.  Your CT scan also showed some hardening of your aorta, which is common as we age.  Follow-up with your primary care doctor and they can recommend any medications to prevent any future problems

## 2024-07-31 NOTE — ED Triage Notes (Signed)
 Pt c/o acid reflux x 1 week, states she has been taking omeprazole  and famotidine  without relief, also states she is nausea, denies any vomiting. C/o lower abd pain 8/10 and pt endorses it feels like acid reflux

## 2024-08-01 ENCOUNTER — Telehealth: Payer: Self-pay

## 2024-08-01 NOTE — Telephone Encounter (Signed)
 Patient contacted office to schedule colonoscopy.  Chart reviewed she was seen yesterday in ER for Epigastric Pain.  She stated that her medication was changed from Omeprazole  to Pantoprazole. ER advised her to follow up with physician in [place] but she knew she had came to our office in the past and would like to come back.   Appt has been scheduled for her to see NP-Robin Edwards for ER Follow up abdominal pain on 08/20/24 at 2pm.  Pt will be due for her colonoscopy 11/05/2024 Per Dr. Kayla results letter dated 11/05/21.  Her last office visit with Ellouise Plain was 10/18/23 for Abdominal pain.  Thanks,  [place], CMA

## 2024-08-16 ENCOUNTER — Other Ambulatory Visit: Payer: Self-pay

## 2024-08-19 NOTE — Progress Notes (Unsigned)
 08/20/2024 Paula Spencer [id number] 1956/04/06  Gastroenterology Office Note     Primary Care Physician:  Buren Rock CHRISTELLA, MD  Primary GI Provider: Jinny Carmine, MD    Chief Complaint   Chief Complaint  Patient presents with   New Patient (Initial Visit)    Epigastric pain-stopped caffiene-certain foods aggravate it-takes pantoprazole -just started 07/31/24 it helps some-     History of Present Illness   Paula Spencer is a 68 y.o. female with PMHX of GERD and abdominal pain, presenting today for epigastric pain.   Patient with history of GERD and reports it has started getting worse again since July 2025.  She is trying to cut out caffeine, she will mix [facility] with water if she drinks it and has cut out coffee.  She continues to have breakthrough acid reflux and some nocturnal symptoms.  Occasionally she may eat later in the day but tries not to do this often.  She is having associated epigastric pain, denies nausea and vomiting.  Patient reports she is also having some mild abdominal pain right lower quadrant. denies NSAID use, alcohol use.  She continues to smoke 5 to 6 cigarettes daily, she is trying to quit.  She has nicotine patches but does not use them.   Patient reports normal bowel habits, has a soft formed bowel movement daily. Denies melena or hematochezia.   Patient seen in the emergency room on 07/31/2024 for evaluation of epigastric pain.  Patient last seen by Ellouise Console, PA on 10/18/2023 with complaints of chronic GERD and generalized abdominal pain which is worse after eating.  Lipase, CBC, CMET unremarkable  07/31/2024 CT Abd Pelvis with contrast IMPRESSION: 1. No acute abnormality in the abdomen or pelvis. 2. Right ventral hernia containing fat and a small portion of colon. No evidence for bowel obstruction or inflammatory changes. 3. Small umbilical hernia containing fat. 4. Aortic Atherosclerosis (ICD10-I70.0). 5. Probable benign right  adrenal nodule as described.  11/03/2021 upper endoscopy - Erosive gastropathy with stigmata of recent bleeding - LA grade a reflux esophagitis with bleeding - Treated with omeprazole  40 mg twice daily for 3 months  11/03/2021 colonoscopy 2 diminutive polyps removed from the ascending colon, one 4 mm polyp removed from descending colon, and a 15 mm polyp removed from descending colon.  Pathology showed sessile serrated polyp and 3 tubular adenomas.  No dysplasia.  Good prep.  - 3 year repeat (due 10/2024).   Past Medical History:  Diagnosis Date   Anxiety    Arthralgia 02/25/2010   Arthralgia of right elbow 05/07/2022   Arthritis    Back pain    Benign paroxysmal positional vertigo 02/11/2022   Carpal tunnel syndrome 02/12/2010   Depression    Encounter for other preprocedural examination 05/03/2021   Epidermoid cyst of skin 05/27/2022   Epigastric pain 11/12/2014   GERD (gastroesophageal reflux disease)    History of partial surgical removal of colon 09/22/2016   Hypertension    Low back pain 12/18/2018   Polycystic disease, ovaries    Vitamin D deficiency     Past Surgical History:  Procedure Laterality Date   ABDOMINAL HYSTERECTOMY     BACK SURGERY     cervical fusion   carpel tunnel     COLONOSCOPY WITH PROPOFOL  N/A 04/02/2016   Procedure: COLONOSCOPY WITH PROPOFOL ;  Surgeon: Gladis RAYMOND Mariner, MD;  Location: [facility] ENDOSCOPY;  Service: Endoscopy;  Laterality: N/A;   COLONOSCOPY WITH PROPOFOL  N/A 11/03/2021   Procedure: COLONOSCOPY  WITH PROPOFOL ;  Surgeon: Unk Corinn Skiff, MD;  Location: [facility] ENDOSCOPY;  Service: Gastroenterology;  Laterality: N/A;   COLOSTOMY REVISION Right 05/06/2016   Procedure: COLON RESECTION RIGHT;  Surgeon: Charlie FORBES Fell, MD;  Location: [facility] ORS;  Service: General;  Laterality: Right;   ESOPHAGOGASTRODUODENOSCOPY (EGD) WITH PROPOFOL  N/A 11/03/2021   Procedure: ESOPHAGOGASTRODUODENOSCOPY (EGD) WITH PROPOFOL ;  Surgeon: Unk Corinn Skiff,  MD;  Location: [facility] ENDOSCOPY;  Service: Gastroenterology;  Laterality: N/A;   PARTIAL HYSTERECTOMY     TOTAL HIP ARTHROPLASTY Left 05/14/2021   TUBAL LIGATION      Current Outpatient Medications  Medication Sig Dispense Refill   acetaminophen  (TYLENOL ) 500 MG tablet Take by mouth.     albuterol (VENTOLIN HFA) 108 (90 Base) MCG/ACT inhaler Inhale 2 puffs into the lungs.     alendronate (FOSAMAX) 70 MG tablet Take 70 mg by mouth once a week.     amLODipine  (NORVASC ) 10 MG tablet Take by mouth.     CALCIUM PO Take by mouth. With Vit D3-takes BID     Cholecalciferol 50 MCG (2000 UT) CAPS Take 2,000 Units by mouth.     famotidine  (PEPCID ) 40 MG tablet Take 1 tablet (40 mg total) by mouth at bedtime. 30 tablet 1   FLUoxetine  (PROZAC ) 10 MG capsule Take 1 capsule (10 mg total) by mouth daily. 30 capsule 1   LORazepam  (ATIVAN ) 1 MG tablet Take 1 mg by mouth as needed for anxiety.     MAGNESIUM PO Take 400 mg by mouth. Takes BID     Na Sulfate-K Sulfate-Mg Sulfate concentrate (SUPREP) 17.5-3.13-1.6 GM/177ML SOLN Take 1 kit (354 mLs total) by mouth once for 1 dose. At 5 PM the day before your procedure pour the contents of one bottle of Suprep into the mixing container provided.  Fill the container, with ice cold water, up to the 16 oz fill line, and drink the entire amount. Then 5 hours before procedure pour the contents of the second bottle of Suprep into the mixing container provided and follow the same instructions. 354 mL 0   pantoprazole  (PROTONIX ) 40 MG tablet Take 40 mg by mouth daily.     SUDOGEST 30 MG tablet Take 30 mg by mouth 2 (two) times daily.     Triamcinolone Acetonide (NASACORT AQ NA) Place into the nose.     Varenicline Tartrate, Starter, 0.5 MG X 11 & 1 MG X 42 TBPK      zolpidem (AMBIEN) 10 MG tablet Take 10 mg by mouth at bedtime as needed.     No current facility-administered medications for this visit.    Allergies as of 08/20/2024 - Review Complete 08/20/2024  Allergen  Reaction Noted   Bupropion  05/27/2022   Citalopram Other (See Comments) 02/27/2021   Diclofenac Other (See Comments) 05/14/2021   Nsaids Other (See Comments) 04/01/2016   Skelaxin [metaxalone] Other (See Comments) 04/01/2016    Family History  Problem Relation Age of Onset   Stroke Mother    Kidney disease Mother    Alcohol abuse Father    Cancer Father    Anxiety disorder Sister    Cancer Sister        blood   Alcohol abuse Brother    Aneurysm Brother    Alcohol abuse Brother    Alcohol abuse Brother    Alcohol abuse Brother    Aneurysm Brother        stomach   Breast cancer Neg Hx     Social History  Socioeconomic History   Marital status: Single    Spouse name: Not on file   Number of children: 0   Years of education: Not on file   Highest education level: Associate degree: occupational, scientist, product/process development, or vocational program  Occupational History   Not on file  Tobacco Use   Smoking status: Every Day    Current packs/day: 0.25    Average packs/day: 0.3 packs/day for 30.0 years (7.5 ttl pk-yrs)    Types: Cigarettes   Smokeless tobacco: Never  Vaping Use   Vaping status: Never Used  Substance and Sexual Activity   Alcohol use: No   Drug use: No   Sexual activity: Not Currently    Birth control/protection: Surgical    Comment: hyst  Other Topics Concern   Not on file  Social History Narrative   Not on file   Social Drivers of Health   Financial Resource Strain: Medium Risk (06/29/2022)   Overall Financial Resource Strain (CARDIA)    Difficulty of Paying Living Expenses: Somewhat hard  Food Insecurity: Food Insecurity Present (06/29/2022)   Hunger Vital Sign    Worried About Running Out of Food in the Last Year: Sometimes true    Ran Out of Food in the Last Year: Often true  Transportation Needs: No Transportation Needs (06/29/2022)   PRAPARE - Administrator, Civil Service (Medical): No    Lack of Transportation (Non-Medical): No  Physical  Activity: Insufficiently Active (06/29/2022)   Exercise Vital Sign    Days of Exercise per Week: 2 days    Minutes of Exercise per Session: 10 min  Stress: Stress Concern Present (06/29/2022)   [employer] of Occupational Health - Occupational Stress Questionnaire    Feeling of Stress : To some extent  Social Connections: Moderately Isolated (06/29/2022)   Social Connection and Isolation Panel    Frequency of Communication with Friends and Family: More than three times a week    Frequency of Social Gatherings with Friends and Family: Once a week    Attends Religious Services: More than 4 times per year    Active Member of Clubs or Organizations: No    Attends Banker Meetings: Never    Marital Status: Never married  Intimate Partner Violence: Not At Risk (06/29/2022)   Humiliation, Afraid, Rape, and Kick questionnaire    Fear of Current or Ex-Partner: No    Emotionally Abused: No    Physically Abused: No    Sexually Abused: No     RELEVANT GI HISTORY, IMAGING AND LABS: CBC    Component Value Date/Time   WBC 7.5 07/31/2024 0929   RBC 4.94 07/31/2024 0929   HGB 14.9 07/31/2024 0929   HGB 15.5 10/18/2023 1548   HCT 45.2 07/31/2024 0929   HCT 48.0 (H) 10/18/2023 1548   PLT 250 07/31/2024 0929   PLT 307 10/18/2023 1548   MCV 91.5 07/31/2024 0929   MCV 92 10/18/2023 1548   MCH 30.2 07/31/2024 0929   MCHC 33.0 07/31/2024 0929   RDW 13.4 07/31/2024 0929   RDW 13.2 10/18/2023 1548   LYMPHSABS 1.8 07/31/2024 0929   LYMPHSABS 3.7 (H) 10/18/2023 1548   MONOABS 0.5 07/31/2024 0929   EOSABS 0.1 07/31/2024 0929   EOSABS 0.1 10/18/2023 1548   BASOSABS 0.0 07/31/2024 0929   BASOSABS 0.0 10/18/2023 1548   Recent Labs    10/18/23 1548 07/31/24 0929  HGB 15.5 14.9    CMP     Component Value Date/Time  NA 140 07/31/2024 0929   NA 142 10/18/2023 1548   K 4.2 07/31/2024 0929   CL 106 07/31/2024 0929   CO2 25 07/31/2024 0929   GLUCOSE 99 07/31/2024 0929   BUN  9 07/31/2024 0929   BUN 13 10/18/2023 1548   CREATININE 1.18 (H) 07/31/2024 0929   CALCIUM 9.1 07/31/2024 0929   PROT 7.0 07/31/2024 0929   PROT 7.5 10/18/2023 1548   ALBUMIN 4.3 07/31/2024 0929   ALBUMIN 4.7 10/18/2023 1548   AST 19 07/31/2024 0929   ALT 8 07/31/2024 0929   ALKPHOS 44 07/31/2024 0929   BILITOT 0.6 07/31/2024 0929   BILITOT 0.4 10/18/2023 1548   GFRNONAA 50 (L) 07/31/2024 0929   GFRAA >60 06/09/2016 1125      Latest Ref Rng & Units 07/31/2024    9:29 AM 10/18/2023    3:48 PM 06/09/2016   11:25 AM  Hepatic Function  Total Protein 6.5 - 8.1 g/dL 7.0  7.5  7.7   Albumin 3.5 - 5.0 g/dL 4.3  4.7  4.4   AST 15 - 41 U/L 19  20  16    ALT 0 - 44 U/L 8  12  14    Alk Phosphatase 38 - 126 U/L 44  52  49   Total Bilirubin 0.0 - 1.2 mg/dL 0.6  0.4  0.5       Review of Systems   All systems reviewed and negative except where noted in HPI.    Physical Exam  BP 126/74   Pulse 87   Temp 98.3 F (36.8 C)   Wt 151 lb 12.8 oz (68.9 kg)   SpO2 99%   BMI 24.50 kg/m  No LMP recorded. Patient has had a hysterectomy. General:   Alert and oriented. Pleasant and cooperative. Well-nourished and well-developed.  In no acute distress Head:  Normocephalic and atraumatic. Eyes:  Without icterus Ears:  Normal auditory acuity. Neck:  Supple; no masses or thyromegaly. Lungs:  Respirations even and unlabored.  Clear throughout to auscultation.   No wheezes, crackles, or rhonchi. No acute distress. Heart:  Regular rate and rhythm; no murmurs, clicks, rubs, or gallops. Abdomen:  Normal bowel sounds.  No bruits.  Soft, non-tender and non-distended without masses, hepatosplenomegaly or hernias noted.  No guarding or rebound tenderness.   Rectal:  Deferred. Msk:  Symmetrical without gross deformities. Normal posture. Extremities:  Without edema. Neurologic:  Alert and  oriented x4;  grossly normal neurologically. Skin:  Intact without significant lesions or rashes. Psych:  Alert and  cooperative. Normal mood and affect.   Assessment & Plan   SAMONE GUHL is a 68 y.o. female presenting today worsening GERD and epigastric pain. Patient also with mild intermittent RLQ abdominal pain.    GERD with associated epigastric pain - Discussed smoking cessation, continue lifestyle modifications - continue pantoprazole  40 mg once daily - Start taking famotidine  40 mg at bedtime. - Schedule EGD in the near future to for GERD, esophagitis, hiatal hernia. I discussed risks of EGD with patient today, including risk of sedation, bleeding or perforation. Patient provides understanding and gave verbal consent to proceed.  Mild RLQ abdominal pain near umbilicus.  - discussed CT results of persistent ventral hernia, similar to Ct in 09/2023.  - discussed surgical referral. Patient would like to wait due to pain only being mild and intermittent at this time.   Repeat colonoscopy -Patient is due for repeat colonoscopy due to poor prep.  Will schedule along with upper endoscopy.  Grayce Bohr, DNP, AGNP-C [facility] Gastroenterology

## 2024-08-20 ENCOUNTER — Encounter: Payer: Self-pay | Admitting: Family Medicine

## 2024-08-20 ENCOUNTER — Ambulatory Visit (INDEPENDENT_AMBULATORY_CARE_PROVIDER_SITE_OTHER): Admitting: Family Medicine

## 2024-08-20 VITALS — BP 126/74 | HR 87 | Temp 98.3°F | Wt 151.8 lb

## 2024-08-20 DIAGNOSIS — Z860100 Personal history of colon polyps, unspecified: Secondary | ICD-10-CM | POA: Diagnosis not present

## 2024-08-20 DIAGNOSIS — R1031 Right lower quadrant pain: Secondary | ICD-10-CM

## 2024-08-20 DIAGNOSIS — R1013 Epigastric pain: Secondary | ICD-10-CM

## 2024-08-20 DIAGNOSIS — K2100 Gastro-esophageal reflux disease with esophagitis, without bleeding: Secondary | ICD-10-CM | POA: Diagnosis not present

## 2024-08-20 DIAGNOSIS — G8929 Other chronic pain: Secondary | ICD-10-CM

## 2024-08-20 MED ORDER — FAMOTIDINE 40 MG PO TABS: 40.0000 mg | ORAL_TABLET | Freq: Every day | ORAL | 1 refills | Status: AC

## 2024-08-20 MED ORDER — NA SULFATE-K SULFATE-MG SULF 17.5-3.13-1.6 GM/177ML PO SOLN: 1.0000 | Freq: Once | ORAL | 0 refills | Status: AC

## 2024-09-02 NOTE — Progress Notes (Deleted)
 Cardiology Office Note  Date:  09/02/2024   ID:  Klyn, Kroening 11-15-1955, MRN [id number]  PCP:  Buren Rock CHRISTELLA, MD   No chief complaint on file.   HPI:  Paula Spencer is a 68 y.o. female with past medical history of: Past Medical History:  Diagnosis Date   Anxiety    Arthralgia 02/25/2010   Arthralgia of right elbow 05/07/2022   Arthritis    Back pain    Benign paroxysmal positional vertigo 02/11/2022   Carpal tunnel syndrome 02/12/2010   Depression    Encounter for other preprocedural examination 05/03/2021   Epidermoid cyst of skin 05/27/2022   Epigastric pain 11/12/2014   GERD (gastroesophageal reflux disease)    History of partial surgical removal of colon 09/22/2016   Hypertension    Low back pain 12/18/2018   Polycystic disease, ovaries    Vitamin D deficiency   Who presents by referral from Dr. Rock Buren for family history of cardiac disease  CT calcium scoring August 2025 Calcium score 119 Images pulled up and reviewed  CT scan July 31, 2024 abdomen Aortic atherosclerosis the abdominal and iliac arteries Images pulled up and reviewed  No recent lipid panel on record Currently not on lipid medication     PMH:   has a past medical history of Anxiety, Arthralgia (02/25/2010), Arthralgia of right elbow (05/07/2022), Arthritis, Back pain, Benign paroxysmal positional vertigo (02/11/2022), Carpal tunnel syndrome (02/12/2010), Depression, Encounter for other preprocedural examination (05/03/2021), Epidermoid cyst of skin (05/27/2022), Epigastric pain (11/12/2014), GERD (gastroesophageal reflux disease), History of partial surgical removal of colon (09/22/2016), Hypertension, Low back pain (12/18/2018), Polycystic disease, ovaries, and Vitamin D deficiency.   PSH:    Past Surgical History:  Procedure Laterality Date   ABDOMINAL HYSTERECTOMY     BACK SURGERY     cervical fusion   carpel tunnel     COLONOSCOPY WITH PROPOFOL  N/A 04/02/2016    Procedure: COLONOSCOPY WITH PROPOFOL ;  Surgeon: Gladis RAYMOND Mariner, MD;  Location: [facility] ENDOSCOPY;  Service: Endoscopy;  Laterality: N/A;   COLONOSCOPY WITH PROPOFOL  N/A 11/03/2021   Procedure: COLONOSCOPY WITH PROPOFOL ;  Surgeon: Unk Corinn Skiff, MD;  Location: [facility] ENDOSCOPY;  Service: Gastroenterology;  Laterality: N/A;   COLOSTOMY REVISION Right 05/06/2016   Procedure: COLON RESECTION RIGHT;  Surgeon: Charlie FORBES Fell, MD;  Location: [facility] ORS;  Service: General;  Laterality: Right;   ESOPHAGOGASTRODUODENOSCOPY (EGD) WITH PROPOFOL  N/A 11/03/2021   Procedure: ESOPHAGOGASTRODUODENOSCOPY (EGD) WITH PROPOFOL ;  Surgeon: Unk Corinn Skiff, MD;  Location: [facility] ENDOSCOPY;  Service: Gastroenterology;  Laterality: N/A;   PARTIAL HYSTERECTOMY     TOTAL HIP ARTHROPLASTY Left 05/14/2021   TUBAL LIGATION      Current Outpatient Medications  Medication Sig Dispense Refill   acetaminophen  (TYLENOL ) 500 MG tablet Take by mouth.     albuterol (VENTOLIN HFA) 108 (90 Base) MCG/ACT inhaler Inhale 2 puffs into the lungs.     alendronate (FOSAMAX) 70 MG tablet Take 70 mg by mouth once a week.     amLODipine  (NORVASC ) 10 MG tablet Take by mouth.     CALCIUM PO Take by mouth. With Vit D3-takes BID     Cholecalciferol 50 MCG (2000 UT) CAPS Take 2,000 Units by mouth.     famotidine  (PEPCID ) 40 MG tablet Take 1 tablet (40 mg total) by mouth at bedtime. 30 tablet 1   FLUoxetine  (PROZAC ) 10 MG capsule Take 1 capsule (10 mg total) by mouth daily. 30 capsule 1   LORazepam  (ATIVAN )  1 MG tablet Take 1 mg by mouth as needed for anxiety.     MAGNESIUM PO Take 400 mg by mouth. Takes BID     pantoprazole  (PROTONIX ) 40 MG tablet Take 40 mg by mouth daily.     SUDOGEST 30 MG tablet Take 30 mg by mouth 2 (two) times daily.     Triamcinolone Acetonide (NASACORT AQ NA) Place into the nose.     Varenicline Tartrate, Starter, 0.5 MG X 11 & 1 MG X 42 TBPK      zolpidem (AMBIEN) 10 MG tablet Take 10 mg by mouth at bedtime as  needed.     No current facility-administered medications for this visit.     Allergies:   Bupropion, Citalopram, Diclofenac, Nsaids, and Skelaxin [metaxalone]   Social History:  The patient  reports that she has been smoking cigarettes. She has a 7.5 pack-year smoking history. She has never used smokeless tobacco. She reports that she does not drink alcohol and does not use drugs.   Family History:   family history includes Alcohol abuse in her brother, brother, brother, brother, and father; Aneurysm in her brother and brother; Anxiety disorder in her sister; Cancer in her father and sister; Kidney disease in her mother; Stroke in her mother.    Review of Systems: ROS   PHYSICAL EXAM: VS:  There were no vitals taken for this visit. , BMI There is no height or weight on file to calculate BMI. GEN: Well nourished, well developed, in no acute distress HEENT: normal Neck: no JVD, carotid bruits, or masses Cardiac: RRR; no murmurs, rubs, or gallops,no edema  Respiratory:  clear to auscultation bilaterally, normal work of breathing GI: soft, nontender, nondistended, + BS MS: no deformity or atrophy Skin: warm and dry, no rash Neuro:  Strength and sensation are intact Psych: euthymic mood, full affect    Recent Labs: 09/26/2023: TSH 1.556 07/31/2024: ALT 8; BUN 9; Creatinine, Ser 1.18; Hemoglobin 14.9; Platelets 250; Potassium 4.2; Sodium 140    Lipid Panel No results found for: CHOL, HDL, LDLCALC, TRIG    Wt Readings from Last 3 Encounters:  08/20/24 151 lb 12.8 oz (68.9 kg)  10/18/23 154 lb 12.8 oz (70.2 kg)  10/14/23 157 lb (71.2 kg)       ASSESSMENT AND PLAN:  Problem List Items Addressed This Visit   None    Disposition:   F/U  12 months   Total encounter time more than 30 minutes  Greater than 50% was spent in counseling and coordination of care with the patient    Signed, Velinda Lunger, M.D., Ph.D. [facility] [place],  [place] [phone]

## 2024-09-04 ENCOUNTER — Ambulatory Visit: Admitting: Cardiovascular Disease

## 2024-09-04 DIAGNOSIS — I2510 Atherosclerotic heart disease of native coronary artery without angina pectoris: Secondary | ICD-10-CM

## 2024-09-04 DIAGNOSIS — I700 Atherosclerosis of aorta: Secondary | ICD-10-CM

## 2024-09-04 DIAGNOSIS — I10 Essential (primary) hypertension: Secondary | ICD-10-CM

## 2024-09-04 DIAGNOSIS — K219 Gastro-esophageal reflux disease without esophagitis: Secondary | ICD-10-CM

## 2024-09-10 NOTE — Progress Notes (Unsigned)
 Cardiology Office Note  Date:  09/11/2024   ID:  Paula, Spencer [birth year], MRN [id number]  PCP:  Buren Rock CHRISTELLA, MD   Chief Complaint  Patient presents with   New Patient (Initial Visit)    Ref by Dr.  Rock Buren a family history of sudden cardiac death in her sister and her mother had a pacemaker implant at age 68.  Patient denies any chest pain or shortness of breath.     HPI:  Paula Spencer is a 68 y.o. female with past medical history of: Past Medical History:  Diagnosis Date   Anxiety    Arthralgia 02/25/2010   Arthralgia of right elbow 05/07/2022   Arthritis    Back pain    Benign paroxysmal positional vertigo 02/11/2022   Carpal tunnel syndrome 02/12/2010   Depression    Encounter for other preprocedural examination 05/03/2021   Epidermoid cyst of skin 05/27/2022   Epigastric pain 11/12/2014   GERD (gastroesophageal reflux disease)    History of partial surgical removal of colon 09/22/2016   Hypertension    Low back pain 12/18/2018   Polycystic disease, ovaries    Vitamin D deficiency   Smoker 6 a day Who presents by referral from Dr. Rock Buren for family history of cardiac disease, aortic atherosclerosis  Reports that she has anxiety Discussed recent events, losses in her family Sister died one month ago, cause unclear, suspected cardiac etiology, age 40 Mother died 12 years ago, pacer, CVA  Goes senior center, active Denies shortness of breath or chest pain on exertion, no leg swelling no PND orthopnea  CT calcium scoring August 2025,  images pulled up and reviewed Calcium score 119, very mild  CT scan July 31, 2024 abdomen,  images pulled up and reviewed Mild aortic atherosclerosis the abdominal and iliac arteries  Reports he previously tried Lipitor but had bodyaches Now on Zetia 10 mg daily, tolerating well  Labs reviewed Total chol 194 , LDL 128 before Zetia   EKG personally reviewed by myself on todays visit EKG  Interpretation Date/Time:  Tuesday September 11 2024 09:28:48 EST Ventricular Rate:  84 PR Interval:  146 QRS Duration:  64 QT Interval:  366 QTC Calculation: 432 R Axis:   12  Text Interpretation: Normal sinus rhythm Normal ECG When compared with ECG of 31-Jul-2024 10:26, No significant change was found Confirmed by Perla Lye [phone]) on 09/11/2024 9:37:10 AM    PMH:   has a past medical history of Anxiety, Arthralgia (02/25/2010), Arthralgia of right elbow (05/07/2022), Arthritis, Back pain, Benign paroxysmal positional vertigo (02/11/2022), Carpal tunnel syndrome (02/12/2010), Depression, Encounter for other preprocedural examination (05/03/2021), Epidermoid cyst of skin (05/27/2022), Epigastric pain (11/12/2014), GERD (gastroesophageal reflux disease), History of partial surgical removal of colon (09/22/2016), Hypertension, Low back pain (12/18/2018), Polycystic disease, ovaries, and Vitamin D deficiency.   PSH:    Past Surgical History:  Procedure Laterality Date   ABDOMINAL HYSTERECTOMY     BACK SURGERY     cervical fusion   carpel tunnel     COLONOSCOPY WITH PROPOFOL  N/A 04/02/2016   Procedure: COLONOSCOPY WITH PROPOFOL ;  Surgeon: Gladis RAYMOND Mariner, MD;  Location: [facility] ENDOSCOPY;  Service: Endoscopy;  Laterality: N/A;   COLONOSCOPY WITH PROPOFOL  N/A 11/03/2021   Procedure: COLONOSCOPY WITH PROPOFOL ;  Surgeon: Unk Corinn Skiff, MD;  Location: [facility] ENDOSCOPY;  Service: Gastroenterology;  Laterality: N/A;   COLOSTOMY REVISION Right 05/06/2016   Procedure: COLON RESECTION RIGHT;  Surgeon: Charlie FORBES Fell, MD;  Location:  [facility] ORS;  Service: General;  Laterality: Right;   ESOPHAGOGASTRODUODENOSCOPY (EGD) WITH PROPOFOL  N/A 11/03/2021   Procedure: ESOPHAGOGASTRODUODENOSCOPY (EGD) WITH PROPOFOL ;  Surgeon: Unk Corinn Skiff, MD;  Location: [facility] ENDOSCOPY;  Service: Gastroenterology;  Laterality: N/A;   PARTIAL HYSTERECTOMY     TOTAL HIP ARTHROPLASTY Left 05/14/2021   TUBAL  LIGATION      Current Outpatient Medications  Medication Sig Dispense Refill   acetaminophen  (TYLENOL ) 500 MG tablet Take by mouth.     albuterol (VENTOLIN HFA) 108 (90 Base) MCG/ACT inhaler Inhale 2 puffs into the lungs.     alendronate (FOSAMAX) 70 MG tablet Take 70 mg by mouth once a week.     amLODipine  (NORVASC ) 10 MG tablet Take by mouth.     CALCIUM PO Take by mouth. With Vit D3-takes BID     Cholecalciferol 50 MCG (2000 UT) CAPS Take 2,000 Units by mouth.     ezetimibe (ZETIA) 10 MG tablet Take 10 mg by mouth daily.     famotidine  (PEPCID ) 40 MG tablet Take 1 tablet (40 mg total) by mouth at bedtime. 30 tablet 1   FLUoxetine  (PROZAC ) 10 MG capsule Take 1 capsule (10 mg total) by mouth daily. 30 capsule 1   lamoTRIgine (LAMICTAL) 25 MG tablet Take 50 mg by mouth daily.     LORazepam  (ATIVAN ) 1 MG tablet Take 1 mg by mouth as needed for anxiety.     MAGNESIUM PO Take 400 mg by mouth. Takes BID     pantoprazole  (PROTONIX ) 40 MG tablet Take 40 mg by mouth daily.     SUDOGEST 30 MG tablet Take 30 mg by mouth 2 (two) times daily.     Triamcinolone Acetonide (NASACORT AQ NA) Place into the nose.     Varenicline Tartrate, Starter, 0.5 MG X 11 & 1 MG X 42 TBPK      zolpidem (AMBIEN) 10 MG tablet Take 10 mg by mouth at bedtime as needed.     No current facility-administered medications for this visit.     Allergies:   Bupropion, Citalopram, Diclofenac, Nsaids, and Skelaxin [metaxalone]   Social History:  The patient  reports that she has been smoking cigarettes. She has a 7.5 pack-year smoking history. She has never used smokeless tobacco. She reports that she does not drink alcohol and does not use drugs.   Family History:   family history includes Alcohol abuse in her brother, brother, brother, brother, and father; Aneurysm in her brother and brother; Anxiety disorder in her sister; Cancer in her father and sister; Kidney disease in her mother; Stroke in her mother; Sudden Cardiac  Death (age of onset: 63) in her sister.    Review of Systems: Review of Systems  Constitutional: Negative.   HENT: Negative.    Respiratory: Negative.    Cardiovascular: Negative.   Gastrointestinal: Negative.   Musculoskeletal: Negative.   Neurological: Negative.   Psychiatric/Behavioral: Negative.    All other systems reviewed and are negative.   PHYSICAL EXAM: VS:  BP 120/70 (BP Location: Right Arm, Patient Position: Sitting, Cuff Size: Normal)   Pulse 84   Ht 5' 6 (1.676 m)   Wt 152 lb (68.9 kg)   SpO2 98%   BMI 24.53 kg/m  , BMI Body mass index is 24.53 kg/m. GEN: Well nourished, well developed, in no acute distress HEENT: normal Neck: no JVD, carotid bruits, or masses Cardiac: RRR; no murmurs, rubs, or gallops,no edema  Respiratory:  clear to auscultation bilaterally, normal work of  breathing GI: soft, nontender, nondistended, + BS MS: no deformity or atrophy Skin: warm and dry, no rash Neuro:  Strength and sensation are intact Psych: euthymic mood, full affect   Recent Labs: 09/26/2023: TSH 1.556 07/31/2024: ALT 8; BUN 9; Creatinine, Ser 1.18; Hemoglobin 14.9; Platelets 250; Potassium 4.2; Sodium 140    Lipid Panel No results found for: CHOL, HDL, LDLCALC, TRIG    Wt Readings from Last 3 Encounters:  09/11/24 152 lb (68.9 kg)  08/20/24 151 lb 12.8 oz (68.9 kg)  10/18/23 154 lb 12.8 oz (70.2 kg)       ASSESSMENT AND PLAN:  Problem List Items Addressed This Visit       Cardiology Problems   Essential hypertension   Relevant Medications   ezetimibe (ZETIA) 10 MG tablet   Other Relevant Orders   EKG 12-Lead (Completed)   Other Visit Diagnoses       Aortic atherosclerosis    -  Primary   Relevant Medications   ezetimibe (ZETIA) 10 MG tablet   Other Relevant Orders   EKG 12-Lead (Completed)     Coronary artery calcification       Relevant Medications   ezetimibe (ZETIA) 10 MG tablet   Other Relevant Orders   EKG 12-Lead (Completed)      Smoker         Family history of cardiac disorder         Myalgia due to statin          Family history cardiac disease Reports mother with pacemaker, died 12 years ago unclear etiology Sister died 1 month ago of unclear cause but she is concerned it may have been cardiac  Coronary calcification Low calcium score, images pulled up and reviewed Calcium predominantly in the LAD Smoking cessation recommended On Zetia, did not tolerate Lipitor Goal LDL less than 70 If above goal on Zetia, could try low-dose Crestor every other day No further testing needed as she is asymptomatic with low calcium score  Smoker We have encouraged her to continue to work on weaning her cigarettes and smoking cessation. She will continue to work on this and does not want any assistance with chantix.    Aortic atherosclerosis Mild disease in the distal descending aorta, images reviewed with her Management as above, smoking cessation, aggressive lipid management  Anxiety Reassurance provided   Signed, Velinda Lunger, M.D., Ph.D. [facility] [place], [place] [phone]

## 2024-09-11 ENCOUNTER — Encounter: Payer: Self-pay | Admitting: Cardiovascular Disease

## 2024-09-11 ENCOUNTER — Ambulatory Visit: Admitting: Cardiovascular Disease

## 2024-09-11 VITALS — BP 120/70 | HR 84 | Ht 66.0 in | Wt 152.0 lb

## 2024-09-11 DIAGNOSIS — I2510 Atherosclerotic heart disease of native coronary artery without angina pectoris: Secondary | ICD-10-CM

## 2024-09-11 DIAGNOSIS — T466X5A Adverse effect of antihyperlipidemic and antiarteriosclerotic drugs, initial encounter: Secondary | ICD-10-CM

## 2024-09-11 DIAGNOSIS — Z8249 Family history of ischemic heart disease and other diseases of the circulatory system: Secondary | ICD-10-CM

## 2024-09-11 DIAGNOSIS — I700 Atherosclerosis of aorta: Secondary | ICD-10-CM

## 2024-09-11 DIAGNOSIS — T466X5D Adverse effect of antihyperlipidemic and antiarteriosclerotic drugs, subsequent encounter: Secondary | ICD-10-CM

## 2024-09-11 DIAGNOSIS — I10 Essential (primary) hypertension: Secondary | ICD-10-CM

## 2024-09-11 DIAGNOSIS — M791 Myalgia, unspecified site: Secondary | ICD-10-CM

## 2024-09-11 DIAGNOSIS — F17200 Nicotine dependence, unspecified, uncomplicated: Secondary | ICD-10-CM

## 2024-09-11 NOTE — Patient Instructions (Signed)

## 2024-09-17 NOTE — H&P (Signed)
 "  Clotilda Schaffer, MD [facility] [address] [place], [place] [address] Phone:[phone] Fax : [phone]  Primary Care Physician:  Buren Rock HERO, MD Primary Gastroenterologist:  Dr. Schaffer  Pre-Procedure History & Physical: HPI:  Paula Spencer is a 68 y.o. female is here for an endoscopy and colonoscopy.  Prior procedure? Fhx CRC? Blood thinners?   Past Medical History:  Diagnosis Date   Anxiety    Arthralgia 02/25/2010   Arthralgia of right elbow 05/07/2022   Arthritis    Back pain    Benign paroxysmal positional vertigo 02/11/2022   Carpal tunnel syndrome 02/12/2010   Depression    Encounter for other preprocedural examination 05/03/2021   Epidermoid cyst of skin 05/27/2022   Epigastric pain 11/12/2014   GERD (gastroesophageal reflux disease)    History of partial surgical removal of colon 09/22/2016   Hypertension    Low back pain 12/18/2018   Polycystic disease, ovaries    Vitamin D deficiency     Past Surgical History:  Procedure Laterality Date   ABDOMINAL HYSTERECTOMY     BACK SURGERY     cervical fusion   carpel tunnel     COLONOSCOPY WITH PROPOFOL  N/A 04/02/2016   Procedure: COLONOSCOPY WITH PROPOFOL ;  Surgeon: Gladis RAYMOND Mariner, MD;  Location: [facility] ENDOSCOPY;  Service: Endoscopy;  Laterality: N/A;   COLONOSCOPY WITH PROPOFOL  N/A 11/03/2021   Procedure: COLONOSCOPY WITH PROPOFOL ;  Surgeon: Unk Corinn Skiff, MD;  Location: [facility] ENDOSCOPY;  Service: Gastroenterology;  Laterality: N/A;   COLOSTOMY REVISION Right 05/06/2016   Procedure: COLON RESECTION RIGHT;  Surgeon: Charlie FORBES Fell, MD;  Location: [facility] ORS;  Service: General;  Laterality: Right;   ESOPHAGOGASTRODUODENOSCOPY (EGD) WITH PROPOFOL  N/A 11/03/2021   Procedure: ESOPHAGOGASTRODUODENOSCOPY (EGD) WITH PROPOFOL ;  Surgeon: Unk Corinn Skiff, MD;  Location: [facility] ENDOSCOPY;  Service: Gastroenterology;  Laterality: N/A;   PARTIAL HYSTERECTOMY     TOTAL HIP ARTHROPLASTY Left 05/14/2021    TUBAL LIGATION      Prior to Admission medications  Medication Sig Start Date End Date Taking? Authorizing Provider  acetaminophen  (TYLENOL ) 500 MG tablet Take by mouth.    [provider]  albuterol (VENTOLIN HFA) 108 (90 Base) MCG/ACT inhaler Inhale 2 puffs into the lungs. 03/26/24   [provider]  alendronate (FOSAMAX) 70 MG tablet Take 70 mg by mouth once a week. 08/18/23   [provider]  amLODipine  (NORVASC ) 10 MG tablet Take by mouth. 08/28/14   [provider]  CALCIUM PO Take by mouth. With Vit D3-takes BID    [provider]  Cholecalciferol 50 MCG (2000 UT) CAPS Take 2,000 Units by mouth.    [provider]  ezetimibe (ZETIA) 10 MG tablet Take 10 mg by mouth daily. 08/21/24   [provider]  famotidine  (PEPCID ) 40 MG tablet Take 1 tablet (40 mg total) by mouth at bedtime. 08/20/24 10/15/24  Celestia Rima, NP  FLUoxetine  (PROZAC ) 10 MG capsule Take 1 capsule (10 mg total) by mouth daily. 09/26/23 09/11/24  Vickey Mettle, MD  lamoTRIgine (LAMICTAL) 25 MG tablet Take 50 mg by mouth daily. 07/16/24   [provider]  LORazepam  (ATIVAN ) 1 MG tablet Take 1 mg by mouth as needed for anxiety.    [provider]  MAGNESIUM PO Take 400 mg by mouth. Takes BID    [provider]  pantoprazole  (PROTONIX ) 40 MG tablet Take 40 mg by mouth daily.    [provider]  SUDOGEST 30 MG tablet Take  30 mg by mouth 2 (two) times daily. 03/20/24   [provider]  Triamcinolone Acetonide (NASACORT AQ NA) Place into the nose.    [provider]  Varenicline Tartrate, Starter, 0.5 MG X 11 & 1 MG X 42 TBPK  11/29/23   [provider]  zolpidem (AMBIEN) 10 MG tablet Take 10 mg by mouth at bedtime as needed. 08/02/23   [provider]    Allergies as of 08/20/2024 - Review Complete 08/20/2024  Allergen Reaction Noted   Bupropion  05/27/2022   Citalopram Other (See Comments)  02/27/2021   Diclofenac Other (See Comments) 05/14/2021   Nsaids Other (See Comments) 04/01/2016   Skelaxin [metaxalone] Other (See Comments) 04/01/2016    Family History  Problem Relation Age of Onset   Stroke Mother    Kidney disease Mother    Alcohol abuse Father    Cancer Father    Anxiety disorder Sister    Sudden Cardiac Death Sister 30   Cancer Sister        blood   Alcohol abuse Brother    Aneurysm Brother    Alcohol abuse Brother    Alcohol abuse Brother    Alcohol abuse Brother    Aneurysm Brother        stomach   Breast cancer Neg Hx     Social History   Socioeconomic History   Marital status: Single    Spouse name: Not on file   Number of children: 0   Years of education: Not on file   Highest education level: Associate degree: occupational, scientist, product/process development, or vocational program  Occupational History   Not on file  Tobacco Use   Smoking status: Every Day    Current packs/day: 0.25    Average packs/day: 0.3 packs/day for 30.0 years (7.5 ttl pk-yrs)    Types: Cigarettes   Smokeless tobacco: Never  Vaping Use   Vaping status: Never Used  Substance and Sexual Activity   Alcohol use: No   Drug use: No   Sexual activity: Not Currently    Birth control/protection: Surgical    Comment: hyst  Other Topics Concern   Not on file  Social History Narrative   Not on file   Social Drivers of Health   Tobacco Use: High Risk (09/11/2024)   Patient History    Smoking Tobacco Use: Every Day    Smokeless Tobacco Use: Never    Passive Exposure: Not on file  Financial Resource Strain: Medium Risk (06/29/2022)   Overall Financial Resource Strain (CARDIA)    Difficulty of Paying Living Expenses: Somewhat hard  Food Insecurity: Food Insecurity Present (06/29/2022)   Hunger Vital Sign    Worried About Running Out of Food in the Last Year: Sometimes true    Ran Out of Food in the Last Year: Often true  Transportation Needs: No Transportation Needs (06/29/2022)   PRAPARE  - Administrator, Civil Service (Medical): No    Lack of Transportation (Non-Medical): No  Physical Activity: Insufficiently Active (06/29/2022)   Exercise Vital Sign    Days of Exercise per Week: 2 days    Minutes of Exercise per Session: 10 min  Stress: Stress Concern Present (06/29/2022)   [employer] of Occupational Health - Occupational Stress Questionnaire    Feeling of Stress : To some extent  Social Connections: Moderately Isolated (06/29/2022)   Social Connection and Isolation Panel    Frequency of Communication with Friends and Family: More than three times a  week    Frequency of Social Gatherings with Friends and Family: Once a week    Attends Religious Services: More than 4 times per year    Active Member of [employer] or Organizations: No    Attends Banker Meetings: Never    Marital Status: Never married  Intimate Partner Violence: Not At Risk (06/29/2022)   Humiliation, Afraid, Rape, and Kick questionnaire    Fear of Current or Ex-Partner: No    Emotionally Abused: No    Physically Abused: No    Sexually Abused: No  Depression (PHQ2-9): Medium Risk (09/26/2023)   Depression (PHQ2-9)    PHQ-2 Score: 6  Alcohol Screen: Low Risk (06/29/2022)   Alcohol Screen    Last Alcohol Screening Score (AUDIT): 0  Housing: Low Risk (06/29/2022)   Housing    Last Housing Risk Score: 0  Utilities: Not At Risk (06/29/2022)   AHC Utilities    Threatened with loss of utilities: No  Health Literacy: Not on file    Review of Systems: See HPI, otherwise negative ROS  Physical Exam: There were no vitals taken for this visit. CONSTITUTIONAL: Well-appearing in no acute distress.  HEENT: Pupils equal, round, Extraocular movements intact. Conjunctivae clear NECK: Neck supple CARDIOVASCULAR: Regular rate, no LE edema  RESPIRATORY: No labored breathing  ABDOMEN: Abdomen soft, nontender, not distended, no guarding, no rigidity SKIN: No apparent skin rashes or  lesions. NEUROLOGIC: Normal speech, no focal findings. Mental status alert and oriented x4. PSYCHIATRIC: Mood and affect normal.   Impression/Plan: Paula Spencer is here for an endoscopy and colonoscopy to be performed for GERD sx and epigastric abdominal pain refractory to BID PPI and Phx colon polyps x 3 in 2023.  Risks, benefits, limitations, and alternatives regarding  endoscopy and colonoscopy have been reviewed with the patient.  Questions have been answered.  All parties agreeable.   MELANY CLOTILDA CHRISTELLA, MD  09/17/2024, 1:24 PM  "

## 2024-09-18 ENCOUNTER — Ambulatory Visit: Admitting: Anesthesiology

## 2024-09-18 ENCOUNTER — Encounter: Payer: Self-pay | Admitting: Gastroenterology

## 2024-09-18 ENCOUNTER — Encounter: Admission: RE | Payer: Self-pay

## 2024-09-18 ENCOUNTER — Ambulatory Visit
Admission: RE | Admit: 2024-09-18 | Discharge: 2024-09-18 | Disposition: A | Discharge status: Home / Self Care | Attending: Gastroenterology | Admitting: Gastroenterology

## 2024-09-18 DIAGNOSIS — M1990 Unspecified osteoarthritis, unspecified site: Secondary | ICD-10-CM | POA: Insufficient documentation

## 2024-09-18 DIAGNOSIS — F17210 Nicotine dependence, cigarettes, uncomplicated: Secondary | ICD-10-CM | POA: Insufficient documentation

## 2024-09-18 DIAGNOSIS — K635 Polyp of colon: Secondary | ICD-10-CM

## 2024-09-18 DIAGNOSIS — Z8711 Personal history of peptic ulcer disease: Secondary | ICD-10-CM | POA: Diagnosis not present

## 2024-09-18 DIAGNOSIS — K2970 Gastritis, unspecified, without bleeding: Secondary | ICD-10-CM | POA: Diagnosis not present

## 2024-09-18 DIAGNOSIS — Z79899 Other long term (current) drug therapy: Secondary | ICD-10-CM | POA: Diagnosis not present

## 2024-09-18 DIAGNOSIS — F419 Anxiety disorder, unspecified: Secondary | ICD-10-CM | POA: Diagnosis not present

## 2024-09-18 DIAGNOSIS — Z1211 Encounter for screening for malignant neoplasm of colon: Secondary | ICD-10-CM | POA: Insufficient documentation

## 2024-09-18 DIAGNOSIS — Z980 Intestinal bypass and anastomosis status: Secondary | ICD-10-CM | POA: Diagnosis not present

## 2024-09-18 DIAGNOSIS — K219 Gastro-esophageal reflux disease without esophagitis: Secondary | ICD-10-CM | POA: Insufficient documentation

## 2024-09-18 DIAGNOSIS — Z860100 Personal history of colon polyps, unspecified: Secondary | ICD-10-CM

## 2024-09-18 DIAGNOSIS — Z59868 Other specified financial insecurity: Secondary | ICD-10-CM | POA: Insufficient documentation

## 2024-09-18 DIAGNOSIS — K640 First degree hemorrhoids: Secondary | ICD-10-CM | POA: Insufficient documentation

## 2024-09-18 DIAGNOSIS — I10 Essential (primary) hypertension: Secondary | ICD-10-CM | POA: Diagnosis not present

## 2024-09-18 DIAGNOSIS — D125 Benign neoplasm of sigmoid colon: Secondary | ICD-10-CM | POA: Insufficient documentation

## 2024-09-18 DIAGNOSIS — Z860101 Personal history of adenomatous and serrated colon polyps: Secondary | ICD-10-CM | POA: Diagnosis present

## 2024-09-18 DIAGNOSIS — K2100 Gastro-esophageal reflux disease with esophagitis, without bleeding: Secondary | ICD-10-CM

## 2024-09-18 DIAGNOSIS — J449 Chronic obstructive pulmonary disease, unspecified: Secondary | ICD-10-CM | POA: Insufficient documentation

## 2024-09-18 DIAGNOSIS — R1013 Epigastric pain: Secondary | ICD-10-CM | POA: Diagnosis present

## 2024-09-18 HISTORY — PX: POLYPECTOMY: SHX149

## 2024-09-18 HISTORY — PX: COLONOSCOPY: SHX5424

## 2024-09-18 HISTORY — PX: BIOPSY OF SKIN SUBCUTANEOUS TISSUE AND/OR MUCOUS MEMBRANE: SHX6741

## 2024-09-18 HISTORY — PX: ESOPHAGOGASTRODUODENOSCOPY: SHX5428

## 2024-09-18 SURGERY — COLONOSCOPY
Anesthesia: General

## 2024-09-18 MED ORDER — PROPOFOL 500 MG/50ML IV EMUL
INTRAVENOUS | Status: DC | PRN
  Administered 2024-09-18: 150 ug/kg/min via INTRAVENOUS

## 2024-09-18 MED ORDER — LIDOCAINE HCL (CARDIAC) PF 100 MG/5ML IV SOSY
PREFILLED_SYRINGE | INTRAVENOUS | Status: DC | PRN
  Administered 2024-09-18: 90 mg via INTRAVENOUS

## 2024-09-18 MED ORDER — SODIUM CHLORIDE 0.9 % IV SOLN: INTRAVENOUS | Status: DC

## 2024-09-18 MED ORDER — PROPOFOL 10 MG/ML IV BOLUS
INTRAVENOUS | Status: DC | PRN
  Administered 2024-09-18: 90 mg via INTRAVENOUS

## 2024-09-18 NOTE — Op Note (Signed)
 [facility] Gastroenterology Patient Name: Paula Spencer Procedure Date: 09/18/2024 11:13 AM MRN: [id number] Account #: [id number] Date of Birth: 1956/04/15 Admit Type: Outpatient Age: 68 Room: [facility] ENDO ROOM 2 Gender: Female Note Status: Finalized Instrument Name: Endoscope [id number] Procedure:             Upper GI endoscopy Indications:           Epigastric abdominal pain, Esophageal reflux symptoms                         that persist despite appropriate therapy Providers:             Clotilda Schaffer, MD Referring MD:          Rock EMERSON Pounds, MD (Referring MD) Medicines:             Propofol  per Anesthesia Complications:         No immediate complications. Procedure:             Pre-Anesthesia Assessment:                        - Prior to the procedure, a History and Physical was                         performed, and patient medications and allergies were                         reviewed. The patient's tolerance of previous                         anesthesia was also reviewed. The risks and benefits                         of the procedure and the sedation options and risks                         were discussed with the patient. All questions were                         answered, and informed consent was obtained. Prior                         Anticoagulants: The patient has taken no anticoagulant                         or antiplatelet agents. ASA Grade Assessment: II - A                         patient with mild systemic disease. After reviewing                         the risks and benefits, the patient was deemed in                         satisfactory condition to undergo the procedure.                        After obtaining informed consent, the endoscope was  passed under direct vision. Throughout the procedure,                         the patient's blood pressure, pulse, and oxygen                         saturations were  monitored continuously. The Endoscope                         was introduced through the mouth, and advanced to the                         third part of duodenum. The upper GI endoscopy was                         accomplished without difficulty. The patient tolerated                         the procedure well. Findings:      The examined esophagus was normal.      The entire examined stomach was normal. Biopsies were taken with a cold       forceps for histology.      The examined duodenum was normal. Biopsies for histology were taken with       a cold forceps for evaluation of celiac disease. Estimated blood loss:       none. Impression:            - Normal esophagus.                        - Normal stomach. Biopsied.                        - Normal examined duodenum. Biopsied. Recommendation:        - Patient has a contact number available for                         emergencies. The signs and symptoms of potential                         delayed complications were discussed with the patient.                         Return to normal activities tomorrow. Written                         discharge instructions were provided to the patient.                        - GERD prevention diet.                        - Continue present medications.                        - Await pathology results.                        - The findings and recommendations were discussed with  the designated responsible adult. Procedure Code(s):     --- Professional ---                        [phone], Esophagogastroduodenoscopy, flexible,                         transoral; with biopsy, single or multiple Diagnosis Code(s):     --- Professional ---                        R10.13, Epigastric pain                        K21.9, Gastro-esophageal reflux disease without                         esophagitis CPT copyright 2022 American Medical Association. All rights reserved. The codes documented  in this report are preliminary and upon coder review may  be revised to meet current compliance requirements. Clotilda Schaffer, MD 09/18/2024 11:36:32 AM Number of Addenda: 0 Note Initiated On: 09/18/2024 11:13 AM Estimated Blood Loss:  Estimated blood loss: none.      [facility]

## 2024-09-18 NOTE — Op Note (Signed)
 [facility] Gastroenterology Patient Name: Paula Spencer Procedure Date: 09/18/2024 11:08 AM MRN: [id number] Account #: [id number] Date of Birth: Dec 31, 1955 Admit Type: Outpatient Age: 68 Room: [facility] ENDO ROOM 2 Gender: Female Note Status: Finalized Instrument Name: Colon Scope [id number] Procedure:             Colonoscopy Indications:           High risk colon cancer surveillance: Personal history                         of colonic polyps Providers:             Clotilda Schaffer, MD Referring MD:          Rock EMERSON Pounds, MD (Referring MD) Medicines:             Propofol  per Anesthesia Complications:         No immediate complications. Procedure:             Pre-Anesthesia Assessment:                        - Prior to the procedure, a History and Physical was                         performed, and patient medications and allergies were                         reviewed. The patient's tolerance of previous                         anesthesia was also reviewed. The risks and benefits                         of the procedure and the sedation options and risks                         were discussed with the patient. All questions were                         answered, and informed consent was obtained. Prior                         Anticoagulants: The patient has taken no anticoagulant                         or antiplatelet agents. ASA Grade Assessment: II - A                         patient with mild systemic disease. After reviewing                         the risks and benefits, the patient was deemed in                         satisfactory condition to undergo the procedure.                        After obtaining informed consent, the colonoscope was  passed under direct vision. Throughout the procedure,                         the patient's blood pressure, pulse, and oxygen                         saturations were monitored continuously. The                          Colonoscope was introduced through the anus and                         advanced to the 5 cm into the ileum. The colonoscopy                         was performed without difficulty. The patient                         tolerated the procedure well. The quality of the bowel                         preparation was good. The terminal ileum, ileocecal                         valve, appendiceal orifice, and rectum were                         photographed. Findings:      A 4 mm polyp was found in the sigmoid colon. The polyp was sessile. The       polyp was removed with a cold snare. Resection and retrieval were       complete. Estimated blood loss: none.      There was evidence of a prior side-to-side ileo-colonic anastomosis in       the ascending colon. This was patent and was characterized by healthy       appearing mucosa. The anastomosis was traversed.      Internal hemorrhoids were found. The hemorrhoids were Grade I (internal       hemorrhoids that do not prolapse).      The terminal ileum appeared normal. Impression:            - One 4 mm polyp in the sigmoid colon, removed with a                         cold snare. Resected and retrieved.                        - Patent side-to-side ileo-colonic anastomosis,                         characterized by healthy appearing mucosa.                        - Internal hemorrhoids.                        - The examined portion of the ileum was normal. Recommendation:        - Patient has a contact number available for  emergencies. The signs and symptoms of potential                         delayed complications were discussed with the patient.                         Return to normal activities tomorrow. Written                         discharge instructions were provided to the patient.                        - High fiber diet.                        - Continue present medications.                         - Await pathology results.                        - Repeat colonoscopy in 5 years for surveillance.                        - The findings and recommendations were discussed with                         the designated responsible adult. Procedure Code(s):     --- Professional ---                        [id number], Colonoscopy, flexible; with removal of                         tumor(s), polyp(s), or other lesion(s) by snare                         technique Diagnosis Code(s):     --- Professional ---                        Z86.010, Personal history of colonic polyps                        D12.5, Benign neoplasm of sigmoid colon                        Z98.0, Intestinal bypass and anastomosis status                        K64.0, First degree hemorrhoids CPT copyright 2022 [employer]. All rights reserved. The codes documented in this report are preliminary and upon coder review may  be revised to meet current compliance requirements. Clotilda Schaffer, MD 09/18/2024 12:02:17 PM Number of Addenda: 0 Note Initiated On: 09/18/2024 11:08 AM Scope Withdrawal Time: 0 hours 13 minutes 47 seconds  Total Procedure Duration: 0 hours 19 minutes 23 seconds  Estimated Blood Loss:  Estimated blood loss: none.      [facility]

## 2024-09-18 NOTE — Transfer of Care (Signed)
 Immediate Anesthesia Transfer of Care Note  Patient: Paula Spencer  Procedure(s) Performed: COLONOSCOPY EGD (ESOPHAGOGASTRODUODENOSCOPY) POLYPECTOMY, INTESTINE  Patient Location: Endoscopy Unit  Anesthesia Type:General  Level of Consciousness: drowsy  Airway & Oxygen Therapy: Patient Spontanous Breathing  Post-op Assessment: Report given to RN and Post -op Vital signs reviewed and stable  Post vital signs: Reviewed and stable  Last Vitals:  Vitals Value Taken Time  BP 93/62 09/18/24 12:03  Temp 36.1 C 09/18/24 12:02  Pulse 79 09/18/24 12:03  Resp 20 09/18/24 12:03  SpO2 98 % 09/18/24 12:03  Vitals shown include unfiled device data.  Last Pain:  Vitals:   09/18/24 1202  TempSrc: Temporal  PainSc: Asleep         Complications: No notable events documented.

## 2024-09-18 NOTE — Anesthesia Preprocedure Evaluation (Signed)
 "                                  Anesthesia Evaluation  Patient identified by MRN, date of birth, ID band Patient awake    Reviewed: Allergy & Precautions, NPO status , Patient's Chart, lab work & pertinent test results  Airway Mallampati: III  TM Distance: >3 FB Neck ROM: full    Dental  (+) Teeth Intact, Partial Lower   Pulmonary neg pulmonary ROS, COPD, Current Smoker   Pulmonary exam normal  + decreased breath sounds      Cardiovascular Exercise Tolerance: Good hypertension, Pt. on medications negative cardio ROS Normal cardiovascular exam Rhythm:Regular Rate:Normal     Neuro/Psych   Anxiety     negative neurological ROS  negative psych ROS   GI/Hepatic negative GI ROS, Neg liver ROS, PUD,GERD  Medicated,,  Endo/Other  negative endocrine ROS    Renal/GU negative Renal ROS  negative genitourinary   Musculoskeletal  (+) Arthritis ,    Abdominal Normal abdominal exam  (+)   Peds negative pediatric ROS (+)  Hematology negative hematology ROS (+)   Anesthesia Other Findings Past Medical History: No date: Anxiety 02/25/2010: Arthralgia 05/07/2022: Arthralgia of right elbow No date: Arthritis No date: Back pain 02/11/2022: Benign paroxysmal positional vertigo 02/12/2010: Carpal tunnel syndrome No date: Depression 05/03/2021: Encounter for other preprocedural examination 05/27/2022: Epidermoid cyst of skin 11/12/2014: Epigastric pain No date: GERD (gastroesophageal reflux disease) 09/22/2016: History of partial surgical removal of colon No date: Hypertension 12/18/2018: Low back pain No date: Polycystic disease, ovaries No date: Vitamin D deficiency  Past Surgical History: No date: ABDOMINAL HYSTERECTOMY No date: BACK SURGERY     Comment:  cervical fusion No date: carpel tunnel 04/02/2016: COLONOSCOPY WITH PROPOFOL ; N/A     Comment:  Procedure: COLONOSCOPY WITH PROPOFOL ;  Surgeon: Gladis RAYMOND Mariner, MD;  Location: [facility]  ENDOSCOPY;  Service:               Endoscopy;  Laterality: N/A; 11/03/2021: COLONOSCOPY WITH PROPOFOL ; N/A     Comment:  Procedure: COLONOSCOPY WITH PROPOFOL ;  Surgeon: Unk Corinn Skiff, MD;  Location: [facility] ENDOSCOPY;  Service:               Gastroenterology;  Laterality: N/A; 05/06/2016: COLOSTOMY REVISION; Right     Comment:  Procedure: COLON RESECTION RIGHT;  Surgeon: Charlie FORBES Fell, MD;  Location: [facility] ORS;  Service: General;                Laterality: Right; 11/03/2021: ESOPHAGOGASTRODUODENOSCOPY (EGD) WITH PROPOFOL ; N/A     Comment:  Procedure: ESOPHAGOGASTRODUODENOSCOPY (EGD) WITH               PROPOFOL ;  Surgeon: Unk Corinn Skiff, MD;  Location:               [facility] ENDOSCOPY;  Service: Gastroenterology;  Laterality:               N/A; No date: PARTIAL HYSTERECTOMY 05/14/2021: TOTAL HIP ARTHROPLASTY; Left No date: TUBAL LIGATION  BMI    Body Mass Index: 24.21 kg/m      Reproductive/Obstetrics negative OB ROS  Anesthesia Physical Anesthesia Plan  ASA: 3  Anesthesia Plan: General   Post-op Pain Management:    Induction: Intravenous  PONV Risk Score and Plan: Propofol  infusion and TIVA  Airway Management Planned: Natural Airway and Nasal Cannula  Additional Equipment:   Intra-op Plan:   Post-operative Plan:   Informed Consent: I have reviewed the patients History and Physical, chart, labs and discussed the procedure including the risks, benefits and alternatives for the proposed anesthesia with the patient or authorized representative who has indicated his/her understanding and acceptance.     Dental Advisory Given  Plan Discussed with: CRNA  Anesthesia Plan Comments:         Anesthesia Quick Evaluation  "

## 2024-09-18 NOTE — Anesthesia Postprocedure Evaluation (Signed)
"   Anesthesia Post Note  Patient: Paula Spencer  Procedure(s) Performed: COLONOSCOPY EGD (ESOPHAGOGASTRODUODENOSCOPY) POLYPECTOMY, INTESTINE  Patient location during evaluation: PACU Anesthesia Type: General Level of consciousness: awake Pain management: pain level controlled Cardiovascular status: stable Anesthetic complications: no   No notable events documented.   Last Vitals:  Vitals:   09/18/24 1212 09/18/24 1222  BP: 113/75 116/84  Pulse: 78 71  Resp: 19 16  Temp:    SpO2: 98% 98%    Last Pain:  Vitals:   09/18/24 1222  TempSrc:   PainSc: 0-No pain                 VAN STAVEREN,Carisa Backhaus      "

## 2024-09-19 LAB — SURGICAL PATHOLOGY

## 2024-09-24 ENCOUNTER — Ambulatory Visit: Payer: Self-pay | Admitting: Gastroenterology
